# Patient Record
Sex: Male | Born: 1955 | Race: White | Hispanic: No | Marital: Married | Smoking: Never smoker
Health system: Southern US, Community
[De-identification: ages and names within clinical notes are randomized; demographics above are authoritative.]

## PROBLEM LIST (undated history)

## (undated) DIAGNOSIS — I10 Essential (primary) hypertension: Secondary | ICD-10-CM

## (undated) HISTORY — PX: RHINOPLASTY: SUR1284

## (undated) HISTORY — PX: HERNIA REPAIR: SHX51

---

## 2001-08-30 ENCOUNTER — Emergency Department (HOSPITAL_COMMUNITY): Admission: EM | Admit: 2001-08-30 | Discharge: 2001-08-30 | Payer: Self-pay | Admitting: Emergency Medicine

## 2001-09-07 ENCOUNTER — Emergency Department (HOSPITAL_COMMUNITY): Admission: EM | Admit: 2001-09-07 | Discharge: 2001-09-07 | Payer: Self-pay | Admitting: Emergency Medicine

## 2006-12-28 ENCOUNTER — Emergency Department (HOSPITAL_COMMUNITY): Admission: EM | Admit: 2006-12-28 | Discharge: 2006-12-28 | Payer: Self-pay | Admitting: Emergency Medicine

## 2010-06-21 ENCOUNTER — Other Ambulatory Visit: Payer: Self-pay | Admitting: Gastroenterology

## 2010-06-21 ENCOUNTER — Ambulatory Visit (HOSPITAL_COMMUNITY)
Admission: RE | Admit: 2010-06-21 | Discharge: 2010-06-21 | Disposition: A | Discharge status: Home / Self Care | Payer: 59 | Source: Ambulatory Visit | Attending: Gastroenterology | Admitting: Gastroenterology

## 2010-06-21 DIAGNOSIS — D126 Benign neoplasm of colon, unspecified: Secondary | ICD-10-CM | POA: Insufficient documentation

## 2010-06-21 DIAGNOSIS — Z1211 Encounter for screening for malignant neoplasm of colon: Secondary | ICD-10-CM | POA: Insufficient documentation

## 2010-06-22 NOTE — Op Note (Signed)
  NAME:  Timothy Hoover, VANGIESON NO.:  [id number]  MEDICAL RECORD NO.:  [id number]           PATIENT TYPE:  O  LOCATION:  [facility]                         FACILITY:  [facility]  PHYSICIAN:  Danise Edge, M.D.   DATE OF BIRTH:  April 26, 1955  DATE OF PROCEDURE:  06/21/2010 DATE OF DISCHARGE:                              OPERATIVE REPORT   REFERRING PHYSICIAN:  Georgann Housekeeper, MD.  HISTORY:  Mr. Kenyata Guess is a 55 year old male born November 22, 1955.  The patient is scheduled to undergo his first screening colonoscopy with polypectomy to prevent colon cancer.  ENDOSCOPIST:  Danise Edge, M.D.  PREMEDICATIONS: 1. Fentanyl 100 mcg. 2. Versed 10 mg.  PROCEDURE:  After obtaining informed consent, the patient was placed in the left lateral decubitus position.  Anal inspection and digital rectal exam were normal.  The Pentax pediatric colonoscope was introduced into the rectum and advanced to the cecum.  Colonic preparation for the exam today was good.  The procedure was somewhat technically difficult to perform due to sigmoid colon loop formation.  Rectum normal.  Retroflex view of the distal rectum normal.  Sigmoid colon and descending colon normal.  Splenic flexure normal.  Transverse colon normal.  Hepatic flexure normal.  Ascending colon normal.  Cecum and ileocecal valve: From the proximal cecum, a 5-mm sessile polyp was removed with the cold snare.  ASSESSMENT: 1. A 5-mm sessile polyp was removed from the cecum with cold snare. 2. Otherwise normal screening proctocolonoscopy to the cecum.  RECOMMENDATIONS:  If cecal polyp returns neoplastic pathologically, the patient should undergo a surveillance colonoscopy in 5 years.  If the cecal polyp is nonneoplastic pathologically, the patient should undergo a repeat screening colonoscopy in 10 years.          ______________________________ Danise Edge, M.D.     MJ/MEDQ  D:  06/21/2010  T:  06/21/2010   Job:  [id number]  cc:   Georgann Housekeeper, MD Fax: [phone]  Electronically Signed by Danise Edge M.D. on 06/22/2010 01:11:59 PM

## 2011-12-06 ENCOUNTER — Emergency Department (HOSPITAL_COMMUNITY)
Admission: EM | Admit: 2011-12-06 | Discharge: 2011-12-07 | Disposition: A | Discharge status: Home / Self Care | Payer: 59 | Attending: Emergency Medicine | Admitting: Emergency Medicine

## 2011-12-06 ENCOUNTER — Encounter (HOSPITAL_COMMUNITY): Payer: Self-pay | Admitting: Emergency Medicine

## 2011-12-06 DIAGNOSIS — Z79899 Other long term (current) drug therapy: Secondary | ICD-10-CM | POA: Insufficient documentation

## 2011-12-06 DIAGNOSIS — H539 Unspecified visual disturbance: Secondary | ICD-10-CM

## 2011-12-06 DIAGNOSIS — I10 Essential (primary) hypertension: Secondary | ICD-10-CM | POA: Insufficient documentation

## 2011-12-06 DIAGNOSIS — H538 Other visual disturbances: Secondary | ICD-10-CM | POA: Insufficient documentation

## 2011-12-06 HISTORY — DX: Essential (primary) hypertension: I10

## 2011-12-06 NOTE — ED Notes (Addendum)
Pt reports being compliant with medications. Pt was panicked over his right eye having blurry vision with flashes of light and blames his high blood pressure on that. Pt reports having flashing lights that started this morning when he was standing at the kitchen making breakfast.

## 2011-12-06 NOTE — ED Notes (Signed)
Patient states that he has flashes of light  Earlier this am to his right eye. The patient also reports that he has intermittent blurred vision to his right eye - also lighting flash to his eye

## 2011-12-07 NOTE — ED Notes (Signed)
MD at bedside. 

## 2011-12-07 NOTE — ED Notes (Signed)
Dr. Lorenso Courier informed. No orders given.

## 2011-12-07 NOTE — ED Provider Notes (Signed)
History     CSN: [id number]  Arrival date & time 12/06/11  2215   First MD Initiated Contact with Patient 12/07/11 0100      Chief Complaint  Patient presents with  . Blurred Vision  . Hypertension    (Consider location/radiation/quality/duration/timing/severity/associated sxs/prior treatment) HPI Comments: Timothy Hoover presents ambulatory for evaluation of a visual disturbance.  He states that he has seen painless light flashes like lightening out of the lateral corner of his right eye.  He reports that they are associated with a transient blurring of the vision in the right eye but states that he feels as if both his central and peripheral vision or at their baseline currently.  He denies and eye trauma, hx of retinal detachment, tearing, FB sensation, eye redness, or pain.  He denies any other complaints and states he was concerned after reading internet sites that he may have a retinal detachment.  He does wear eyeglasses, contact lenses, and has previously had laser eye surgery.  He also reports a hx of floaters.  Patient is a 56 y.o. male presenting with hypertension. The history is provided by the patient. No language interpreter was used.  Hypertension This is a new problem. The current episode started 3 to 5 hours ago. The problem occurs rarely. Progression since onset: occurring sporadically. Pertinent negatives include no chest pain, no abdominal pain, no headaches and no shortness of breath. Nothing aggravates the symptoms. Nothing relieves the symptoms. He has tried nothing for the symptoms.    Past Medical History  Diagnosis Date  . Hypertension     Past Surgical History  Procedure Date  . Hernia repair   . Rhinoplasty     History reviewed. No pertinent family history.  History  Substance Use Topics  . Smoking status: Never Smoker   . Smokeless tobacco: Not on file  . Alcohol Use: Yes     occasional      Review of Systems  Constitutional: Negative.   HENT:  Negative.   Eyes: Positive for visual disturbance. Negative for photophobia, pain, discharge, redness and itching.  Respiratory: Negative.  Negative for shortness of breath.   Cardiovascular: Negative for chest pain.  Gastrointestinal: Negative.  Negative for abdominal pain.  Genitourinary: Negative.   Musculoskeletal: Negative.   Neurological: Negative for headaches.  Psychiatric/Behavioral: Negative.     Allergies  Review of patient's allergies indicates no known allergies.  Home Medications   Current Outpatient Rx  Name Route Sig Dispense Refill  . OMEGA-3 FATTY ACIDS 1000 MG PO CAPS Oral Take 1 g by mouth daily.    Marland Kitchen RAMIPRIL 5 MG PO CAPS Oral Take 5 mg by mouth daily.      BP 152/93  Pulse 56  Temp 97.9 F (36.6 C) (Oral)  Resp 18  SpO2 99%  Physical Exam  Constitutional: He is oriented to person, place, and time. He appears well-developed and well-nourished. No distress.  HENT:  Head: Normocephalic and atraumatic. Not macrocephalic and not microcephalic. Head is without abrasion. Hair is normal. No trismus in the jaw.  Right Ear: Tympanic membrane, external ear and ear canal normal.  Left Ear: Hearing, tympanic membrane, external ear and ear canal normal.  Nose: Nose normal. No mucosal edema or rhinorrhea. No epistaxis.  No foreign bodies.  Mouth/Throat: Uvula is midline, oropharynx is clear and moist and mucous membranes are normal. Mucous membranes are not pale, not dry and not cyanotic. He does not have dentures. No oral lesions. Normal dentition. No  dental abscesses, uvula swelling, lacerations or dental caries. No oropharyngeal exudate.  Eyes: Conjunctivae, EOM and lids are normal. Pupils are equal, round, and reactive to light. Right eye exhibits no chemosis, no discharge, no exudate and no hordeolum. No foreign body present in the right eye. Left eye exhibits no chemosis, no discharge, no exudate and no hordeolum. No foreign body present in the left eye. Right  conjunctiva is not injected. Right conjunctiva has no hemorrhage. Left conjunctiva is not injected. Left conjunctiva has no hemorrhage. No scleral icterus. Right eye exhibits normal extraocular motion and no nystagmus. Left eye exhibits normal extraocular motion and no nystagmus. Right pupil is round and reactive. Left pupil is round and reactive. Pupils are equal.  Fundoscopic exam:      The right eye shows no arteriolar narrowing, no exudate, no hemorrhage and no papilledema. The right eye shows no red reflex.      The left eye shows no arteriolar narrowing, no exudate, no hemorrhage and no papilledema. The left eye shows no red reflex. Neck: Normal range of motion. Neck supple. No JVD present. No tracheal deviation present. No thyromegaly present.  Cardiovascular: Normal rate, regular rhythm, normal heart sounds and intact distal pulses.  Exam reveals no gallop and no friction rub.   No murmur heard. Pulmonary/Chest: Effort normal and breath sounds normal. No stridor. No respiratory distress. He has no wheezes. He has no rales. He exhibits no tenderness.  Abdominal: Soft. Bowel sounds are normal. There is no tenderness. There is no guarding.  Musculoskeletal: Normal range of motion. He exhibits no edema and no tenderness.  Lymphadenopathy:    He has no cervical adenopathy.  Neurological: He is alert and oriented to person, place, and time. No cranial nerve deficit. Coordination normal.  Skin: Skin is warm and dry. No rash noted. He is not diaphoretic. No erythema. No pallor.  Psychiatric: He has a normal mood and affect. His behavior is normal. Thought content normal.    ED Course  Procedures (including critical care time)  Labs Reviewed - No data to display No results found.   No diagnosis found.    MDM  Pt presents for evaluation of a unilateral visual disturbance.  There is no asymmetry on his fundoscopic exam and he has a normal visual acuity.  He is having no pain and describes  sporadic intermittent flashes of light only visible out of the corner of his eye.  On exam, no evidence of a retinal detachment are appreciated.  Plan at this time is to discharge home.  He has been instructed to follow-up closely with either the on-call eye specialist of his eye doctor within the next 72 hours.  He has also been instructed that if the symptoms are to worsen or if he appreciates any change in his visual acuity to return immediately to the emergency department if he cannot see his eye specialist immediately.        Tobin Chad, MD 12/07/11 [phone]

## 2011-12-07 NOTE — ED Notes (Signed)
MD left bedside

## 2011-12-07 NOTE — ED Notes (Signed)
Pt reports no flashing lights now, but it happens every once in awhile throughout the day since this morning. Pt states the blurry vision is "probably due to dry eye because when I blink it's gone."

## 2012-01-16 ENCOUNTER — Other Ambulatory Visit: Payer: Self-pay | Admitting: Dermatology

## 2015-05-17 DIAGNOSIS — M720 Palmar fascial fibromatosis [Dupuytren]: Secondary | ICD-10-CM | POA: Diagnosis not present

## 2015-05-17 DIAGNOSIS — B078 Other viral warts: Secondary | ICD-10-CM | POA: Diagnosis not present

## 2015-05-17 DIAGNOSIS — Z85828 Personal history of other malignant neoplasm of skin: Secondary | ICD-10-CM | POA: Diagnosis not present

## 2015-06-14 MED FILL — RAMIPRIL 5 MG CAPSULE: 5 | 90 days supply | Qty: 90 | Fill #3

## 2015-07-19 DIAGNOSIS — N403 Nodular prostate with lower urinary tract symptoms: Secondary | ICD-10-CM | POA: Diagnosis not present

## 2015-07-26 DIAGNOSIS — N5201 Erectile dysfunction due to arterial insufficiency: Secondary | ICD-10-CM | POA: Diagnosis not present

## 2015-07-26 DIAGNOSIS — R351 Nocturia: Secondary | ICD-10-CM | POA: Diagnosis not present

## 2015-07-26 DIAGNOSIS — Z0000 Encounter for general adult medical examination without abnormal findings: Secondary | ICD-10-CM | POA: Diagnosis not present

## 2015-07-26 DIAGNOSIS — N403 Nodular prostate with lower urinary tract symptoms: Secondary | ICD-10-CM | POA: Diagnosis not present

## 2015-08-08 ENCOUNTER — Other Ambulatory Visit: Payer: Self-pay | Admitting: Internal Medicine

## 2015-08-08 DIAGNOSIS — R7309 Other abnormal glucose: Secondary | ICD-10-CM | POA: Diagnosis not present

## 2015-08-08 DIAGNOSIS — Z1389 Encounter for screening for other disorder: Secondary | ICD-10-CM | POA: Diagnosis not present

## 2015-08-08 DIAGNOSIS — Z0000 Encounter for general adult medical examination without abnormal findings: Secondary | ICD-10-CM | POA: Diagnosis not present

## 2015-08-08 DIAGNOSIS — N182 Chronic kidney disease, stage 2 (mild): Secondary | ICD-10-CM | POA: Diagnosis not present

## 2015-08-08 DIAGNOSIS — K219 Gastro-esophageal reflux disease without esophagitis: Secondary | ICD-10-CM | POA: Diagnosis not present

## 2015-08-08 DIAGNOSIS — R1011 Right upper quadrant pain: Secondary | ICD-10-CM

## 2015-08-08 DIAGNOSIS — R109 Unspecified abdominal pain: Secondary | ICD-10-CM | POA: Diagnosis not present

## 2015-08-08 DIAGNOSIS — I10 Essential (primary) hypertension: Secondary | ICD-10-CM | POA: Diagnosis not present

## 2015-08-08 DIAGNOSIS — E782 Mixed hyperlipidemia: Secondary | ICD-10-CM | POA: Diagnosis not present

## 2015-08-08 DIAGNOSIS — N402 Nodular prostate without lower urinary tract symptoms: Secondary | ICD-10-CM | POA: Diagnosis not present

## 2015-08-09 DIAGNOSIS — H524 Presbyopia: Secondary | ICD-10-CM | POA: Diagnosis not present

## 2015-08-14 ENCOUNTER — Other Ambulatory Visit: Payer: Self-pay | Admitting: Internal Medicine

## 2015-08-14 ENCOUNTER — Ambulatory Visit
Admission: RE | Admit: 2015-08-14 | Discharge: 2015-08-14 | Disposition: A | Discharge status: Home / Self Care | Payer: 59 | Source: Ambulatory Visit | Attending: Internal Medicine | Admitting: Internal Medicine

## 2015-08-14 DIAGNOSIS — E278 Other specified disorders of adrenal gland: Secondary | ICD-10-CM

## 2015-08-14 DIAGNOSIS — R1011 Right upper quadrant pain: Secondary | ICD-10-CM | POA: Diagnosis not present

## 2015-08-16 MED FILL — diazePAM 5 MG TABS: 5 | 1 days supply | Qty: 2 | Fill #0

## 2015-08-19 ENCOUNTER — Ambulatory Visit
Admission: RE | Admit: 2015-08-19 | Discharge: 2015-08-19 | Disposition: A | Discharge status: Home / Self Care | Payer: 59 | Source: Ambulatory Visit | Attending: Internal Medicine | Admitting: Internal Medicine

## 2015-08-19 DIAGNOSIS — E278 Other specified disorders of adrenal gland: Secondary | ICD-10-CM

## 2015-08-19 DIAGNOSIS — K8689 Other specified diseases of pancreas: Secondary | ICD-10-CM | POA: Diagnosis not present

## 2015-08-19 MED ORDER — GADOBENATE DIMEGLUMINE 529 MG/ML IV SOLN
20.0000 mL | Freq: Once | INTRAVENOUS | Status: AC | PRN
  Administered 2015-08-19: 18 mL via INTRAVENOUS

## 2015-09-12 MED FILL — RAMIPRIL 5 MG CAPSULE: 5 | 90 days supply | Qty: 90 | Fill #0

## 2015-09-13 ENCOUNTER — Other Ambulatory Visit: Payer: Self-pay | Admitting: Gastroenterology

## 2015-09-13 MED FILL — GAVILYTE-N SOLUTION: 420 | 1 days supply | Qty: 4000 | Fill #0

## 2015-10-23 ENCOUNTER — Encounter (HOSPITAL_COMMUNITY): Payer: Self-pay | Admitting: *Deleted

## 2015-10-31 ENCOUNTER — Ambulatory Visit (HOSPITAL_COMMUNITY): Payer: 59 | Admitting: Anesthesiology

## 2015-10-31 ENCOUNTER — Ambulatory Visit (HOSPITAL_COMMUNITY)
Admission: RE | Admit: 2015-10-31 | Discharge: 2015-10-31 | Disposition: A | Discharge status: Home / Self Care | Payer: 59 | Source: Ambulatory Visit | Attending: Gastroenterology | Admitting: Gastroenterology

## 2015-10-31 ENCOUNTER — Encounter (HOSPITAL_COMMUNITY)
Admission: RE | Disposition: A | Discharge status: Home / Self Care | Payer: Self-pay | Source: Ambulatory Visit | Attending: Gastroenterology

## 2015-10-31 ENCOUNTER — Encounter (HOSPITAL_COMMUNITY): Payer: Self-pay

## 2015-10-31 DIAGNOSIS — I10 Essential (primary) hypertension: Secondary | ICD-10-CM | POA: Diagnosis not present

## 2015-10-31 DIAGNOSIS — Z86010 Personal history of colonic polyps: Secondary | ICD-10-CM | POA: Diagnosis not present

## 2015-10-31 DIAGNOSIS — Z1211 Encounter for screening for malignant neoplasm of colon: Secondary | ICD-10-CM | POA: Insufficient documentation

## 2015-10-31 DIAGNOSIS — K5790 Diverticulosis of intestine, part unspecified, without perforation or abscess without bleeding: Secondary | ICD-10-CM | POA: Diagnosis not present

## 2015-10-31 HISTORY — PX: COLONOSCOPY WITH PROPOFOL: SHX5780

## 2015-10-31 SURGERY — COLONOSCOPY WITH PROPOFOL
Anesthesia: Monitor Anesthesia Care

## 2015-10-31 MED ORDER — LACTATED RINGERS IV SOLN
INTRAVENOUS | Status: DC
  Administered 2015-10-31: 09:00:00 via INTRAVENOUS

## 2015-10-31 MED ORDER — PROPOFOL 500 MG/50ML IV EMUL
INTRAVENOUS | Status: DC | PRN
  Administered 2015-10-31: 100 ug/kg/min via INTRAVENOUS

## 2015-10-31 MED ORDER — GLYCOPYRROLATE 0.2 MG/ML IJ SOLN
INTRAMUSCULAR | Status: AC
  Filled 2015-10-31: qty 1

## 2015-10-31 MED ORDER — PROPOFOL 10 MG/ML IV BOLUS
INTRAVENOUS | Status: AC
  Filled 2015-10-31: qty 60

## 2015-10-31 MED ORDER — LIDOCAINE HCL (CARDIAC) 20 MG/ML IV SOLN
INTRAVENOUS | Status: AC
  Filled 2015-10-31: qty 5

## 2015-10-31 MED ORDER — PROPOFOL 10 MG/ML IV BOLUS
INTRAVENOUS | Status: DC | PRN
  Administered 2015-10-31: 20 mg via INTRAVENOUS
  Administered 2015-10-31: 40 mg via INTRAVENOUS
  Administered 2015-10-31: 20 mg via INTRAVENOUS
  Administered 2015-10-31: 80 mg via INTRAVENOUS

## 2015-10-31 MED ORDER — LIDOCAINE HCL (CARDIAC) 20 MG/ML IV SOLN
INTRAVENOUS | Status: DC | PRN
  Administered 2015-10-31: 50 mg via INTRAVENOUS

## 2015-10-31 MED ORDER — SODIUM CHLORIDE 0.9 % IV SOLN: INTRAVENOUS | Status: DC

## 2015-10-31 MED ORDER — GLYCOPYRROLATE 0.2 MG/ML IJ SOLN
INTRAMUSCULAR | Status: DC | PRN
  Administered 2015-10-31: 0.2 mg via INTRAVENOUS

## 2015-10-31 SURGICAL SUPPLY — 21 items

## 2015-10-31 NOTE — Op Note (Signed)
[facility] Patient Name: Timothy Hoover Procedure Date: 10/31/2015 MRN: [id number] Attending MD: Garlan Fair , MD Date of Birth: [birth year] CSN: [id number] Age: 60 Admit Type: Outpatient Procedure:                Colonoscopy Indications:              High risk colon cancer surveillance: 06/21/2010                            colonoscopy was performed with removal of a 5 mm                            cecal tubular adenomatous polyp. Providers:                Garlan Fair, MD, Carmie End, RN, [facility] Tech, Technician Referring MD:              Medicines:                Propofol per Anesthesia Complications:            No immediate complications. Estimated Blood Loss:     Estimated blood loss: none. Procedure:                Pre-Anesthesia Assessment:                           - Prior to the procedure, a History and Physical                            was performed, and patient medications and                            allergies were reviewed. The patient's tolerance of                            previous anesthesia was also reviewed. The risks                            and benefits of the procedure and the sedation                            options and risks were discussed with the patient.                            All questions were answered, and informed consent                            was obtained. Prior Anticoagulants: The patient has                            taken no previous anticoagulant or antiplatelet  agents. ASA Grade Assessment: II - A patient with                            mild systemic disease. After reviewing the risks                            and benefits, the patient was deemed in                            satisfactory condition to undergo the procedure.                           After obtaining informed consent, the colonoscope                            was passed  under direct vision. Throughout the                            procedure, the patient's blood pressure, pulse, and                            oxygen saturations were monitored continuously. The                            Colonoscope was introduced through the anus and                            advanced to the the cecum, identified by                            appendiceal orifice and ileocecal valve. The                            colonoscopy was somewhat difficult due to                            significant looping. The patient tolerated the                            procedure well. The quality of the bowel                            preparation was good. The terminal ileum, the                            ileocecal valve, the appendiceal orifice and the                            rectum were photographed. Scope In: 10:09:48 AM Scope Out: 10:30:25 AM Scope Withdrawal Time: 0 hours 10 minutes 52 seconds  Total Procedure Duration: 0 hours 20 minutes 37 seconds  Findings:      The perianal and digital rectal examinations were normal.      The entire examined colon appeared normal. Impression:               -  The entire examined colon is normal.                           - No specimens collected. Moderate Sedation:      N/A- Per Anesthesia Care Recommendation:           - Repeat colonoscopy in 10 years for surveillance.                           - Patient has a contact number available for                            emergencies. The signs and symptoms of potential                            delayed complications were discussed with the                            patient. Return to normal activities tomorrow.                            Written discharge instructions were provided to the                            patient.                           - Resume previous diet.                           - Continue present medications. Procedure Code(s):        --- Professional ---                            [phone], Colonoscopy, flexible; diagnostic, including                            collection of specimen(s) by brushing or washing,                            when performed (separate procedure) Diagnosis Code(s):        --- Professional ---                           Z86.010, Personal history of colonic polyps CPT copyright 2016 [employer]. All rights reserved. The codes documented in this report are preliminary and upon coder review may  be revised to meet current compliance requirements. Earle Gell, MD Garlan Fair, MD 10/31/2015 10:36:48 AM This report has been signed electronically. Number of Addenda: 0

## 2015-10-31 NOTE — Transfer of Care (Signed)
Immediate Anesthesia Transfer of Care Note  Patient: Timothy Hoover  Procedure(s) Performed: Procedure(s): COLONOSCOPY WITH PROPOFOL (N/A)  Patient Location: PACU  Anesthesia Type:MAC  Level of Consciousness: Patient easily awoken, sedated, comfortable, cooperative, following commands, responds to stimulation.   Airway & Oxygen Therapy: Patient spontaneously breathing, ventilating well, oxygen via simple oxygen mask.  Post-op Assessment: Report given to PACU RN, vital signs reviewed and stable, moving all extremities.   Post vital signs: Reviewed and stable.  Complications: No apparent anesthesia complications

## 2015-10-31 NOTE — H&P (Signed)
  Procedure: Surveillance colonoscopy. 06/21/2010 colonoscopy was performed with removal of a 5 mm tubular adenomatous cecal polyp. 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 He is scheduled to undergo a surveillance colonoscopy today.  Past medical history: Right inguinal hernia surgery. Deviated septum surgery. Left on detached retina repair. Gastroesophageal reflux. Hypertension.  Exam: The patient is alert and lying comfortably on the endoscopy stretcher. Abdomen is soft and nontender to palpation. Clear to auscultation. Cardiac exam reveals a regular rhythm.  Plan: Proceed with surveillance colonoscopy

## 2015-10-31 NOTE — Anesthesia Postprocedure Evaluation (Signed)
Anesthesia Post Note  Patient: Timothy Hoover  Procedure(s) Performed: Procedure(s) (LRB): COLONOSCOPY WITH PROPOFOL (N/A)  Patient location during evaluation: PACU Anesthesia Type: MAC Level of consciousness: awake and alert and oriented Pain management: pain level controlled Vital Signs Assessment: post-procedure vital signs reviewed and stable Respiratory status: spontaneous breathing, nonlabored ventilation and respiratory function stable Cardiovascular status: blood pressure returned to baseline and stable Postop Assessment: no signs of nausea or vomiting Anesthetic complications: no    Last Vitals:  Filed Vitals:   10/31/15 1036 10/31/15 1040  BP: 118/75 105/71  Pulse: 56 56  Temp:    Resp: 16 14    Last Pain: There were no vitals filed for this visit.               Lash Matulich A.

## 2015-10-31 NOTE — Discharge Instructions (Signed)

## 2015-10-31 NOTE — Anesthesia Postprocedure Evaluation (Signed)
Anesthesia Post Note  Patient: Timothy Hoover  Procedure(s) Performed: Procedure(s) (LRB): COLONOSCOPY WITH PROPOFOL (N/A)  Patient location during evaluation: PACU Anesthesia Type: MAC Level of consciousness: awake and alert and oriented Pain management: pain level controlled Vital Signs Assessment: post-procedure vital signs reviewed and stable Respiratory status: spontaneous breathing, nonlabored ventilation and respiratory function stable Cardiovascular status: blood pressure returned to baseline and stable Postop Assessment: no signs of nausea or vomiting Anesthetic complications: no    Last Vitals:  Filed Vitals:   10/31/15 1036 10/31/15 1040  BP: 118/75 105/71  Pulse: 56 56  Temp:    Resp: 16 14    Last Pain: There were no vitals filed for this visit.               [name]

## 2015-10-31 NOTE — Anesthesia Preprocedure Evaluation (Signed)
Anesthesia Evaluation  Patient identified by MRN, date of birth, ID band Patient awake    Reviewed: Allergy & Precautions, NPO status , Patient's Chart, lab work & pertinent test results  Airway Mallampati: II  TM Distance: >3 FB Neck ROM: Full    Dental no notable dental hx. (+) Teeth Intact   Pulmonary neg pulmonary ROS,    Pulmonary exam normal breath sounds clear to auscultation       Cardiovascular hypertension, Pt. on medications Normal cardiovascular exam Rhythm:Regular Rate:Normal     Neuro/Psych negative neurological ROS  negative psych ROS   GI/Hepatic negative GI ROS, Neg liver ROS,   Endo/Other  negative endocrine ROS  Renal/GU negative Renal ROS  negative genitourinary   Musculoskeletal negative musculoskeletal ROS (+)   Abdominal   Peds  Hematology negative hematology ROS (+)   Anesthesia Other Findings   Reproductive/Obstetrics negative OB ROS                             Anesthesia Physical Anesthesia Plan  ASA: II  Anesthesia Plan: MAC   Post-op Pain Management:    Induction: Intravenous  Airway Management Planned: Natural Airway and Simple Face Mask  Additional Equipment:   Intra-op Plan:   Post-operative Plan:   Informed Consent: I have reviewed the patients History and Physical, chart, labs and discussed the procedure including the risks, benefits and alternatives for the proposed anesthesia with the patient or authorized representative who has indicated his/her understanding and acceptance.   Dental advisory given  Plan Discussed with: Anesthesiologist, CRNA and Surgeon  Anesthesia Plan Comments:         Anesthesia Quick Evaluation

## 2015-11-01 ENCOUNTER — Encounter (HOSPITAL_COMMUNITY): Payer: Self-pay | Admitting: Gastroenterology

## 2015-12-13 MED FILL — RAMIPRIL 5 MG CAPSULE: 5 | 90 days supply | Qty: 90 | Fill #1

## 2015-12-21 DIAGNOSIS — H35372 Puckering of macula, left eye: Secondary | ICD-10-CM | POA: Diagnosis not present

## 2015-12-21 DIAGNOSIS — H43392 Other vitreous opacities, left eye: Secondary | ICD-10-CM | POA: Diagnosis not present

## 2015-12-21 DIAGNOSIS — H43812 Vitreous degeneration, left eye: Secondary | ICD-10-CM | POA: Diagnosis not present

## 2016-02-14 DIAGNOSIS — L570 Actinic keratosis: Secondary | ICD-10-CM | POA: Diagnosis not present

## 2016-02-14 DIAGNOSIS — Z85828 Personal history of other malignant neoplasm of skin: Secondary | ICD-10-CM | POA: Diagnosis not present

## 2016-02-14 DIAGNOSIS — L821 Other seborrheic keratosis: Secondary | ICD-10-CM | POA: Diagnosis not present

## 2016-02-14 MED FILL — FLUOROURACIL 5% CREAM: 5 | 10 days supply | Qty: 40 | Fill #0

## 2016-03-13 MED FILL — RAMIPRIL 5 MG CAPSULE: 5 | 90 days supply | Qty: 90 | Fill #2

## 2016-04-30 MED FILL — valACYclovir HCL 1 GM TABS: 1 | 1 days supply | Qty: 4 | Fill #0

## 2016-05-13 DIAGNOSIS — I10 Essential (primary) hypertension: Secondary | ICD-10-CM | POA: Diagnosis not present

## 2016-05-13 DIAGNOSIS — Z8249 Family history of ischemic heart disease and other diseases of the circulatory system: Secondary | ICD-10-CM | POA: Diagnosis not present

## 2016-05-13 DIAGNOSIS — Z23 Encounter for immunization: Secondary | ICD-10-CM | POA: Diagnosis not present

## 2016-05-13 DIAGNOSIS — D3501 Benign neoplasm of right adrenal gland: Secondary | ICD-10-CM | POA: Diagnosis not present

## 2016-05-15 DIAGNOSIS — I10 Essential (primary) hypertension: Secondary | ICD-10-CM | POA: Diagnosis not present

## 2016-05-15 DIAGNOSIS — D3501 Benign neoplasm of right adrenal gland: Secondary | ICD-10-CM | POA: Diagnosis not present

## 2016-05-21 MED FILL — DEXAMETHASONE 1 MG TABLET: 1 | 1 days supply | Qty: 1 | Fill #0

## 2016-05-24 DIAGNOSIS — D3501 Benign neoplasm of right adrenal gland: Secondary | ICD-10-CM | POA: Diagnosis not present

## 2016-05-24 DIAGNOSIS — Z8249 Family history of ischemic heart disease and other diseases of the circulatory system: Secondary | ICD-10-CM | POA: Diagnosis not present

## 2016-05-24 DIAGNOSIS — I10 Essential (primary) hypertension: Secondary | ICD-10-CM | POA: Diagnosis not present

## 2016-05-24 DIAGNOSIS — R7301 Impaired fasting glucose: Secondary | ICD-10-CM | POA: Diagnosis not present

## 2016-07-04 MED FILL — RAMIPRIL 5 MG CAPSULE: 5 | 90 days supply | Qty: 90 | Fill #3

## 2016-07-05 DIAGNOSIS — H5213 Myopia, bilateral: Secondary | ICD-10-CM | POA: Diagnosis not present

## 2016-07-05 DIAGNOSIS — H524 Presbyopia: Secondary | ICD-10-CM | POA: Diagnosis not present

## 2016-07-23 DIAGNOSIS — M238X1 Other internal derangements of right knee: Secondary | ICD-10-CM | POA: Diagnosis not present

## 2016-08-08 ENCOUNTER — Other Ambulatory Visit: Payer: Self-pay | Admitting: Internal Medicine

## 2016-08-08 DIAGNOSIS — D3501 Benign neoplasm of right adrenal gland: Secondary | ICD-10-CM | POA: Diagnosis not present

## 2016-08-08 DIAGNOSIS — N402 Nodular prostate without lower urinary tract symptoms: Secondary | ICD-10-CM | POA: Diagnosis not present

## 2016-08-08 DIAGNOSIS — Z0000 Encounter for general adult medical examination without abnormal findings: Secondary | ICD-10-CM | POA: Diagnosis not present

## 2016-08-08 DIAGNOSIS — I10 Essential (primary) hypertension: Secondary | ICD-10-CM | POA: Diagnosis not present

## 2016-08-08 DIAGNOSIS — D136 Benign neoplasm of pancreas: Secondary | ICD-10-CM | POA: Diagnosis not present

## 2016-08-08 DIAGNOSIS — R7309 Other abnormal glucose: Secondary | ICD-10-CM | POA: Diagnosis not present

## 2016-08-08 DIAGNOSIS — Z23 Encounter for immunization: Secondary | ICD-10-CM | POA: Diagnosis not present

## 2016-08-08 DIAGNOSIS — Z1389 Encounter for screening for other disorder: Secondary | ICD-10-CM | POA: Diagnosis not present

## 2016-08-08 MED FILL — diazePAM 5 MG TABS: 5 | 1 days supply | Qty: 1 | Fill #0

## 2016-08-14 DIAGNOSIS — N402 Nodular prostate without lower urinary tract symptoms: Secondary | ICD-10-CM | POA: Diagnosis not present

## 2016-08-14 DIAGNOSIS — N5201 Erectile dysfunction due to arterial insufficiency: Secondary | ICD-10-CM | POA: Diagnosis not present

## 2016-08-24 ENCOUNTER — Ambulatory Visit
Admission: RE | Admit: 2016-08-24 | Discharge: 2016-08-24 | Disposition: A | Discharge status: Home / Self Care | Payer: 59 | Source: Ambulatory Visit | Attending: Internal Medicine | Admitting: Internal Medicine

## 2016-08-24 DIAGNOSIS — D136 Benign neoplasm of pancreas: Secondary | ICD-10-CM

## 2016-08-24 MED ORDER — GADOBENATE DIMEGLUMINE 529 MG/ML IV SOLN
20.0000 mL | Freq: Once | INTRAVENOUS | Status: AC | PRN
  Administered 2016-08-24: 19 mL via INTRAVENOUS

## 2016-09-04 MED FILL — valACYclovir HCL 1 GM TABS: 1 | 1 days supply | Qty: 4 | Fill #1

## 2016-09-09 DIAGNOSIS — R748 Abnormal levels of other serum enzymes: Secondary | ICD-10-CM | POA: Diagnosis not present

## 2016-09-09 DIAGNOSIS — N402 Nodular prostate without lower urinary tract symptoms: Secondary | ICD-10-CM | POA: Diagnosis not present

## 2016-10-02 MED FILL — RAMIPRIL 5 MG CAPSULE: 5 | 90 days supply | Qty: 90 | Fill #0

## 2016-10-22 MED FILL — valACYclovir HCL 1 GM TABS: 1 | 1 days supply | Qty: 4 | Fill #2

## 2016-12-04 DIAGNOSIS — Z23 Encounter for immunization: Secondary | ICD-10-CM | POA: Diagnosis not present

## 2016-12-11 MED FILL — valACYclovir HCL 1 GM TABS: 1 | 2 days supply | Qty: 4 | Fill #0

## 2016-12-18 MED FILL — SILDENAFIL 20 MG TABLET: 20 | 25 days supply | Qty: 50 | Fill #0

## 2017-01-06 MED FILL — RAMIPRIL 5 MG CAPSULE: 5 | 90 days supply | Qty: 90 | Fill #1

## 2017-02-05 DIAGNOSIS — Z23 Encounter for immunization: Secondary | ICD-10-CM | POA: Diagnosis not present

## 2017-03-04 MED FILL — SILDENAFIL 20 MG TABLET: 20 | 25 days supply | Qty: 50 | Fill #1

## 2017-04-09 DIAGNOSIS — D225 Melanocytic nevi of trunk: Secondary | ICD-10-CM | POA: Diagnosis not present

## 2017-04-09 DIAGNOSIS — D1801 Hemangioma of skin and subcutaneous tissue: Secondary | ICD-10-CM | POA: Diagnosis not present

## 2017-04-09 DIAGNOSIS — L570 Actinic keratosis: Secondary | ICD-10-CM | POA: Diagnosis not present

## 2017-04-09 DIAGNOSIS — L820 Inflamed seborrheic keratosis: Secondary | ICD-10-CM | POA: Diagnosis not present

## 2017-04-09 DIAGNOSIS — Z85828 Personal history of other malignant neoplasm of skin: Secondary | ICD-10-CM | POA: Diagnosis not present

## 2017-04-09 DIAGNOSIS — D485 Neoplasm of uncertain behavior of skin: Secondary | ICD-10-CM | POA: Diagnosis not present

## 2017-04-09 DIAGNOSIS — D2262 Melanocytic nevi of left upper limb, including shoulder: Secondary | ICD-10-CM | POA: Diagnosis not present

## 2017-04-09 DIAGNOSIS — D2261 Melanocytic nevi of right upper limb, including shoulder: Secondary | ICD-10-CM | POA: Diagnosis not present

## 2017-04-09 DIAGNOSIS — L821 Other seborrheic keratosis: Secondary | ICD-10-CM | POA: Diagnosis not present

## 2017-04-09 DIAGNOSIS — L819 Disorder of pigmentation, unspecified: Secondary | ICD-10-CM | POA: Diagnosis not present

## 2017-04-11 MED FILL — RAMIPRIL 5 MG CAPS: 5 | 90 days supply | Qty: 90 | Fill #2

## 2017-05-16 MED FILL — SILDENAFIL 20 MG TABLET: 20 | 25 days supply | Qty: 50 | Fill #2

## 2017-05-26 DIAGNOSIS — I10 Essential (primary) hypertension: Secondary | ICD-10-CM | POA: Diagnosis not present

## 2017-05-26 DIAGNOSIS — Z8249 Family history of ischemic heart disease and other diseases of the circulatory system: Secondary | ICD-10-CM | POA: Diagnosis not present

## 2017-05-26 DIAGNOSIS — D3501 Benign neoplasm of right adrenal gland: Secondary | ICD-10-CM | POA: Diagnosis not present

## 2017-05-26 DIAGNOSIS — R7303 Prediabetes: Secondary | ICD-10-CM | POA: Diagnosis not present

## 2017-07-03 DIAGNOSIS — H1789 Other corneal scars and opacities: Secondary | ICD-10-CM | POA: Diagnosis not present

## 2017-07-03 DIAGNOSIS — H59812 Chorioretinal scars after surgery for detachment, left eye: Secondary | ICD-10-CM | POA: Diagnosis not present

## 2017-07-03 DIAGNOSIS — H1045 Other chronic allergic conjunctivitis: Secondary | ICD-10-CM | POA: Diagnosis not present

## 2017-07-03 DIAGNOSIS — H5213 Myopia, bilateral: Secondary | ICD-10-CM | POA: Diagnosis not present

## 2017-07-03 DIAGNOSIS — H524 Presbyopia: Secondary | ICD-10-CM | POA: Diagnosis not present

## 2017-07-03 DIAGNOSIS — H52221 Regular astigmatism, right eye: Secondary | ICD-10-CM | POA: Diagnosis not present

## 2017-07-03 DIAGNOSIS — H43393 Other vitreous opacities, bilateral: Secondary | ICD-10-CM | POA: Diagnosis not present

## 2017-07-21 MED FILL — SILDENAFIL 20 MG TABLET: 20 | 25 days supply | Qty: 50 | Fill #3

## 2017-07-21 MED FILL — RAMIPRIL 5 MG CAPS: 5 | 90 days supply | Qty: 90 | Fill #3

## 2017-08-20 ENCOUNTER — Other Ambulatory Visit: Payer: Self-pay | Admitting: Internal Medicine

## 2017-08-20 DIAGNOSIS — N402 Nodular prostate without lower urinary tract symptoms: Secondary | ICD-10-CM | POA: Diagnosis not present

## 2017-08-20 DIAGNOSIS — D136 Benign neoplasm of pancreas: Secondary | ICD-10-CM

## 2017-08-20 DIAGNOSIS — Z1159 Encounter for screening for other viral diseases: Secondary | ICD-10-CM | POA: Diagnosis not present

## 2017-08-20 MED FILL — diazePAM 5 MG TABS: 5 | 1 days supply | Qty: 1 | Fill #0

## 2017-08-21 MED FILL — valACYclovir HCL 1 GM TABS: 1 | 2 days supply | Qty: 4 | Fill #1

## 2017-08-24 ENCOUNTER — Ambulatory Visit
Admission: RE | Admit: 2017-08-24 | Discharge: 2017-08-24 | Disposition: A | Discharge status: Home / Self Care | Payer: 59 | Source: Ambulatory Visit | Attending: Internal Medicine | Admitting: Internal Medicine

## 2017-08-24 DIAGNOSIS — D136 Benign neoplasm of pancreas: Secondary | ICD-10-CM | POA: Diagnosis not present

## 2017-08-24 MED ORDER — GADOBENATE DIMEGLUMINE 529 MG/ML IV SOLN
17.0000 mL | Freq: Once | INTRAVENOUS | Status: AC | PRN
  Administered 2017-08-24: 17 mL via INTRAVENOUS

## 2017-08-25 MED FILL — SIMVASTATIN 20 MG TABS: 20 | 30 days supply | Qty: 30 | Fill #0

## 2017-09-01 ENCOUNTER — Other Ambulatory Visit: Payer: Self-pay | Admitting: Urology

## 2017-09-01 DIAGNOSIS — R972 Elevated prostate specific antigen [PSA]: Secondary | ICD-10-CM

## 2017-09-01 DIAGNOSIS — N402 Nodular prostate without lower urinary tract symptoms: Secondary | ICD-10-CM

## 2017-09-01 MED FILL — diazePAM 10 MG TABS: 10 | 1 days supply | Qty: 2 | Fill #0

## 2017-09-10 ENCOUNTER — Ambulatory Visit
Admission: RE | Admit: 2017-09-10 | Discharge: 2017-09-10 | Disposition: A | Discharge status: Home / Self Care | Payer: 59 | Source: Ambulatory Visit | Attending: Urology | Admitting: Urology

## 2017-09-10 DIAGNOSIS — R972 Elevated prostate specific antigen [PSA]: Secondary | ICD-10-CM | POA: Diagnosis not present

## 2017-09-10 DIAGNOSIS — N402 Nodular prostate without lower urinary tract symptoms: Secondary | ICD-10-CM

## 2017-09-10 MED ORDER — GADOBENATE DIMEGLUMINE 529 MG/ML IV SOLN
18.0000 mL | Freq: Once | INTRAVENOUS | Status: AC | PRN
  Administered 2017-09-10: 18 mL via INTRAVENOUS

## 2017-09-26 MED FILL — SIMVASTATIN 20 MG TABS: 20 | 90 days supply | Qty: 90 | Fill #0

## 2017-09-26 MED FILL — SILDENAFIL 20 MG TABLET: 20 | 25 days supply | Qty: 50 | Fill #4

## 2017-11-06 MED FILL — RAMIPRIL 5 MG CAPS: 5 | 90 days supply | Qty: 90 | Fill #0

## 2017-11-14 MED FILL — diazePAM 10 MG TABS: 10 | 1 days supply | Qty: 1 | Fill #0

## 2017-11-14 MED FILL — levoFLOXacin 750 MG TABS: 750 | 1 days supply | Qty: 1 | Fill #0

## 2017-11-17 MED FILL — valACYclovir HCL 1 GM TABS: 1 | 2 days supply | Qty: 4 | Fill #0

## 2017-11-19 DIAGNOSIS — N402 Nodular prostate without lower urinary tract symptoms: Secondary | ICD-10-CM | POA: Diagnosis not present

## 2017-12-01 MED FILL — SILDENAFIL CITRATE 20 MG TA: 20 | 25 days supply | Qty: 50 | Fill #5

## 2017-12-26 DIAGNOSIS — E782 Mixed hyperlipidemia: Secondary | ICD-10-CM | POA: Diagnosis not present

## 2017-12-26 DIAGNOSIS — R221 Localized swelling, mass and lump, neck: Secondary | ICD-10-CM | POA: Diagnosis not present

## 2017-12-31 MED FILL — SIMVASTATIN 20 MG TABLET: 20 | 90 days supply | Qty: 90 | Fill #1

## 2018-01-07 DIAGNOSIS — D485 Neoplasm of uncertain behavior of skin: Secondary | ICD-10-CM | POA: Diagnosis not present

## 2018-01-07 DIAGNOSIS — C44311 Basal cell carcinoma of skin of nose: Secondary | ICD-10-CM | POA: Diagnosis not present

## 2018-01-07 DIAGNOSIS — Z85828 Personal history of other malignant neoplasm of skin: Secondary | ICD-10-CM | POA: Diagnosis not present

## 2018-02-09 MED FILL — RAMIPRIL 5 MG CAPS: 5 | 90 days supply | Qty: 90 | Fill #1

## 2018-02-09 MED FILL — SILDENAFIL CITRATE 20 MG TA: 20 | 25 days supply | Qty: 50 | Fill #0

## 2018-02-11 DIAGNOSIS — Z23 Encounter for immunization: Secondary | ICD-10-CM | POA: Diagnosis not present

## 2018-02-23 DIAGNOSIS — C44311 Basal cell carcinoma of skin of nose: Secondary | ICD-10-CM | POA: Diagnosis not present

## 2018-02-23 DIAGNOSIS — Z85828 Personal history of other malignant neoplasm of skin: Secondary | ICD-10-CM | POA: Diagnosis not present

## 2018-03-29 ENCOUNTER — Emergency Department (HOSPITAL_COMMUNITY): Payer: 59 | Admitting: Anesthesiology

## 2018-03-29 ENCOUNTER — Encounter (HOSPITAL_COMMUNITY): Payer: Self-pay | Admitting: Emergency Medicine

## 2018-03-29 ENCOUNTER — Ambulatory Visit (HOSPITAL_COMMUNITY)
Admission: EM | Admit: 2018-03-29 | Discharge: 2018-03-29 | Disposition: A | Discharge status: Home / Self Care | Payer: 59 | Attending: Emergency Medicine | Admitting: Emergency Medicine

## 2018-03-29 ENCOUNTER — Encounter (HOSPITAL_COMMUNITY)
Admission: EM | Disposition: A | Discharge status: Home / Self Care | Payer: Self-pay | Source: Home / Self Care | Attending: Emergency Medicine

## 2018-03-29 DIAGNOSIS — K209 Esophagitis, unspecified: Secondary | ICD-10-CM | POA: Insufficient documentation

## 2018-03-29 DIAGNOSIS — R632 Polyphagia: Secondary | ICD-10-CM | POA: Diagnosis not present

## 2018-03-29 DIAGNOSIS — T18108A Unspecified foreign body in esophagus causing other injury, initial encounter: Secondary | ICD-10-CM | POA: Diagnosis not present

## 2018-03-29 DIAGNOSIS — Z79899 Other long term (current) drug therapy: Secondary | ICD-10-CM | POA: Insufficient documentation

## 2018-03-29 DIAGNOSIS — X58XXXA Exposure to other specified factors, initial encounter: Secondary | ICD-10-CM | POA: Diagnosis not present

## 2018-03-29 DIAGNOSIS — R079 Chest pain, unspecified: Secondary | ICD-10-CM | POA: Diagnosis not present

## 2018-03-29 DIAGNOSIS — I10 Essential (primary) hypertension: Secondary | ICD-10-CM | POA: Insufficient documentation

## 2018-03-29 DIAGNOSIS — T18128A Food in esophagus causing other injury, initial encounter: Secondary | ICD-10-CM | POA: Diagnosis not present

## 2018-03-29 DIAGNOSIS — K222 Esophageal obstruction: Secondary | ICD-10-CM | POA: Diagnosis not present

## 2018-03-29 HISTORY — PX: ESOPHAGOGASTRODUODENOSCOPY (EGD) WITH PROPOFOL: SHX5813

## 2018-03-29 HISTORY — PX: FOREIGN BODY REMOVAL: SHX962

## 2018-03-29 SURGERY — ESOPHAGOGASTRODUODENOSCOPY (EGD) WITH PROPOFOL
Anesthesia: General

## 2018-03-29 MED ORDER — SUCCINYLCHOLINE CHLORIDE 200 MG/10ML IV SOSY
PREFILLED_SYRINGE | INTRAVENOUS | Status: DC | PRN
  Administered 2018-03-29: 180 mg via INTRAVENOUS

## 2018-03-29 MED ORDER — GLUCAGON HCL RDNA (DIAGNOSTIC) 1 MG IJ SOLR
1.0000 mg | Freq: Once | INTRAMUSCULAR | Status: AC
  Administered 2018-03-29: 1 mg via INTRAVENOUS
  Filled 2018-03-29: qty 1

## 2018-03-29 MED ORDER — EPHEDRINE SULFATE-NACL 50-0.9 MG/10ML-% IV SOSY
PREFILLED_SYRINGE | INTRAVENOUS | Status: DC | PRN
  Administered 2018-03-29: 5 mg via INTRAVENOUS

## 2018-03-29 MED ORDER — LIDOCAINE 2% (20 MG/ML) 5 ML SYRINGE
INTRAMUSCULAR | Status: DC | PRN
  Administered 2018-03-29: 80 mg via INTRAVENOUS

## 2018-03-29 MED ORDER — ONDANSETRON HCL 4 MG/2ML IJ SOLN
INTRAMUSCULAR | Status: DC | PRN
  Administered 2018-03-29: 4 mg via INTRAVENOUS

## 2018-03-29 MED ORDER — PROPOFOL 10 MG/ML IV BOLUS
INTRAVENOUS | Status: DC | PRN
  Administered 2018-03-29: 170 mg via INTRAVENOUS

## 2018-03-29 MED ORDER — FENTANYL CITRATE (PF) 250 MCG/5ML IJ SOLN
INTRAMUSCULAR | Status: DC | PRN
  Administered 2018-03-29 (×2): 50 ug via INTRAVENOUS

## 2018-03-29 MED ORDER — MIDAZOLAM HCL 2 MG/2ML IJ SOLN
INTRAMUSCULAR | Status: AC
  Filled 2018-03-29: qty 2

## 2018-03-29 MED ORDER — LACTATED RINGERS IV SOLN
INTRAVENOUS | Status: DC
  Administered 2018-03-29: 1000 mL via INTRAVENOUS

## 2018-03-29 MED ORDER — PROPOFOL 10 MG/ML IV BOLUS
INTRAVENOUS | Status: AC
Start: 2018-03-29 — End: ?
  Filled 2018-03-29: qty 20

## 2018-03-29 MED ORDER — ROCURONIUM BROMIDE 10 MG/ML (PF) SYRINGE
PREFILLED_SYRINGE | INTRAVENOUS | Status: DC | PRN
  Administered 2018-03-29: 10 mg via INTRAVENOUS
  Administered 2018-03-29: 30 mg via INTRAVENOUS
  Administered 2018-03-29: 10 mg via INTRAVENOUS

## 2018-03-29 MED ORDER — SUGAMMADEX SODIUM 200 MG/2ML IV SOLN
INTRAVENOUS | Status: DC | PRN
  Administered 2018-03-29: 350 mg via INTRAVENOUS

## 2018-03-29 MED ORDER — FENTANYL CITRATE (PF) 100 MCG/2ML IJ SOLN
INTRAMUSCULAR | Status: AC
  Filled 2018-03-29: qty 2

## 2018-03-29 MED ORDER — SODIUM CHLORIDE 0.9 % IV SOLN: INTRAVENOUS | Status: DC

## 2018-03-29 MED ORDER — PROMETHAZINE HCL 25 MG/ML IJ SOLN: 6.2500 mg | INTRAMUSCULAR | Status: DC | PRN

## 2018-03-29 MED ORDER — DEXAMETHASONE SODIUM PHOSPHATE 10 MG/ML IJ SOLN
INTRAMUSCULAR | Status: DC | PRN
  Administered 2018-03-29: 4 mg via INTRAVENOUS

## 2018-03-29 MED ORDER — MIDAZOLAM HCL 2 MG/2ML IJ SOLN
INTRAMUSCULAR | Status: DC | PRN
  Administered 2018-03-29: 2 mg via INTRAVENOUS

## 2018-03-29 SURGICAL SUPPLY — 14 items

## 2018-03-29 NOTE — Transfer of Care (Signed)
Immediate Anesthesia Transfer of Care Note  Patient: Timothy Hoover  Procedure(s) Performed: endoscopy (N/A ) FOREIGN BODY REMOVAL  Patient Location: PACU  Anesthesia Type:General  Level of Consciousness: awake, alert  and oriented  Airway & Oxygen Therapy: Patient Spontanous Breathing and Patient connected to face mask oxygen  Post-op Assessment: Report given to RN and Post -op Vital signs reviewed and stable  Post vital signs: Reviewed and stable  Last Vitals:  Vitals Value Taken Time  BP 153/86 03/29/2018  3:32 PM  Temp    Pulse 61 03/29/2018  3:34 PM  Resp 10 03/29/2018  3:34 PM  SpO2 100 % 03/29/2018  3:34 PM  Vitals shown include unvalidated device data.  Last Pain:  Vitals:   03/29/18 1422  TempSrc: Oral  PainSc: 0-No pain         Complications: No apparent anesthesia complications

## 2018-03-29 NOTE — Op Note (Signed)
[facility] Patient Name: Timothy Hoover Procedure Date: 03/29/2018 MRN: [id number] Attending MD: Nancy Fetter Dr., MD Date of Birth: [birth year] CSN: [id number] Age: 62 Admit Type: Emergency Department Procedure:                Upper GI endoscopy and removal of food impacted in                            the esophagus Indications:              Foreign body in the esophagus, patient ate a hot                            dog about 24 hours ago and has been unable to                            swallow since Providers:                Jeneen Rinks L. Brittanyann Wittner Dr., MD, Cleda Daub, RN,                            Laverda Sorenson, Technician, Leandro Reasoner, CRNA Referring MD:              Medicines:                Monitored Anesthesia Care, due to the history the                            patient was intubated prior to the procedure Complications:            No immediate complications. Estimated Blood Loss:     Estimated blood loss: none. Procedure:                Pre-Anesthesia Assessment:                           - Prior to the procedure, a History and Physical                            was performed, and patient medications and                            allergies were reviewed. The patient's tolerance of                            previous anesthesia was also reviewed. The risks                            and benefits of the procedure and the sedation                            options and risks were discussed with the patient.                            All questions were answered, and informed consent  was obtained. Prior Anticoagulants: The patient has                            taken no previous anticoagulant or antiplatelet                            agents. ASA Grade Assessment: II - A patient with                            mild systemic disease. After reviewing the risks                            and benefits, the patient was deemed in                         satisfactory condition to undergo the procedure.                           After obtaining informed consent, the endoscope was                            passed under direct vision. Throughout the                            procedure, the patient's blood pressure, pulse, and                            oxygen saturations were monitored continuously. The                            GIF-H190 ([id number] Olympus adult endoscope was                            introduced through the mouth, and advanced to the                            second part of duodenum. The upper GI endoscopy was                            technically difficult and complex due to presence                            of food. The patient tolerated the procedure fairly                            well. Scope In: Scope Out: Findings:      Food was found in the entire esophagus. This was impacted throughout the       esophagus. Multiple passes of the scope were made with the rescue net       and Quadra pod retriever used to break up the food material in we were       able to pull out pieces of it. It was soft and mushy and came apart.       Approximately half was removed and  after this we were able to break up       the other pieces and push into the stomach. Upon removal of the scope       after all the food material had been removed there were small pieces       seen up in the oropharynx around the ET tube that were grabbed with a       biopsy forceps and pulled out.      Mildly severe esophagitis with no bleeding was found at the       gastroesophageal junction.      The stomach was normal.      The examined duodenum was normal. Impression:               - Food in the esophagus. The food was impacting                            throughout the esophagus and extensive passage of                            the scope was required to remove. It was quite soft                            and mushy.                            - Mildly severe esophagitis.                           - Normal stomach.                           - Normal examined duodenum.                           - No specimens collected. Moderate Sedation:      See anesthesia note, no moderate sedation. Recommendation:           - Discharge patient to home (ambulatory).                           - Clear liquid diet for 1 day.                           - Use Prilosec (omeprazole) 20 mg PO daily.                           - Return to endoscopist in 1 month. Procedure Code(s):        --- Professional ---                           [phone], Esophagogastroduodenoscopy, flexible,                            transoral; diagnostic, including collection of                            specimen(s) by brushing or washing, when performed                            (  separate procedure) Diagnosis Code(s):        --- Professional ---                           J19.417E, Food in esophagus causing other injury,                            initial encounter                           T18.108A, Unspecified foreign body in esophagus                            causing other injury, initial encounter                           K20.9, Esophagitis, unspecified CPT copyright 2018 [employer]. All rights reserved. The codes documented in this report are preliminary and upon coder review may  be revised to meet current compliance requirements. Nancy Fetter Dr., MD 03/29/2018 3:32:00 PM This report has been signed electronically. Number of Addenda: 0

## 2018-03-29 NOTE — Consult Note (Signed)
[place] Reason for consult: Foreign body in the esophagus Referring Physician: ER.  PCP: Dr. Deforest Hoyles.  Primary GI: Dr. Winnifred Friar is an 62 y.o. male.  HPI:  Patient had been doing well.  He notes that he very often has sensation of food sticking in his lower esophagus usually solid food that will eventually pass.  He was at [employer] game yesterday and around 2 PM ate a hot dog.  He is been unable to swallow since then and felt the discomfort in the subxiphoid area.  He has been sitting up most of the night they gave him some water in the ER and he regurgitated this back up.  He has been regurgitating his saliva.  Empiric trial of glucagon has not really helped.  He has not had an EGD but has had previous colonoscopies.  Denies any allergies or any problems with chronic reflux.  He will take a couple of Tums every few months.  Past Medical History:  Diagnosis Date  . Hypertension     Past Surgical History:  Procedure Laterality Date  . COLONOSCOPY WITH PROPOFOL N/A 10/31/2015   Procedure: COLONOSCOPY WITH PROPOFOL;  Surgeon: Garlan Fair, MD;  Location: [facility];  Service: Endoscopy;  Laterality: N/A;  . HERNIA REPAIR    . RHINOPLASTY      No family history on file.  Social History:  reports that he has never smoked. He has never used smokeless tobacco. He reports that he drinks alcohol. He reports that he does not use drugs.  Allergies: No Known Allergies  Medications; Prior to Admission medications   Medication Sig Start Date End Date Taking? Authorizing Provider  ramipril (ALTACE) 5 MG capsule Take 5 mg by mouth daily.   Yes [provider]  sildenafil (REVATIO) 20 MG tablet Take 20 mg by mouth daily. 02/09/18  Yes [provider]  simvastatin (ZOCOR) 20 MG tablet Take 20 mg by mouth daily. 12/31/17  Yes [provider]    PRN Meds  No results found for this or any previous visit (from the  past 48 hour(s)).  No results found.             Blood pressure (!) 149/94, pulse (!) 54, temperature 98.2 F (36.8 C), resp. rate 18, height 6' (1.829 m), weight 83.9 kg, SpO2 98 %.  Physical exam:   General--white male in no acute distress ENT--nonicteric Neck--supple no masses are palpated Heart--regular rate and rhythm without murmurs or gallops Lungs--clear Abdomen--soft and nontender Psych--normal mood and affect, alert and oriented   Assessment: 1.  Esophageal obstruction secondary to hot dog  Plan: We will plan on going ahead later today with EGD and removal of hotdog.  Have discussed this with the patient including the risk and benefits.  The possibility of bleeding perforation failure to remove the hot dog etc. have all been discussed.   Nancy Fetter 03/29/2018, 1:59 PM   This note was created using voice recognition software and minor errors may Have occurred unintentionally. Pager: [phone] If no answer or after hours call [phone]

## 2018-03-29 NOTE — ED Triage Notes (Signed)
Pt reports was at ball game yesterday and got choked on hotdog. Did have to vomit. Reports that since he isnt able to swallow and liquids or foods. Pt is able to swallow saliva.

## 2018-03-29 NOTE — Discharge Instructions (Addendum)
YOU HAD AN ENDOSCOPIC PROCEDURE TODAY: Refer to the procedure report and other information in the discharge instructions given to you for any specific questions about what was found during the examination. If this information does not answer your questions, please call [facility] office at [phone] to clarify.   YOU SHOULD EXPECT: Some feelings of bloating in the abdomen. Passage of more gas than usual. Walking can help get rid of the air that was put into your GI tract during the procedure and reduce the bloating. If you had a lower endoscopy (such as a colonoscopy or flexible sigmoidoscopy) you may notice spotting of blood in your stool or on the toilet paper. Some abdominal soreness may be present for a day or two, also.  DIET: Your first meal following the procedure should be a light meal and then it is ok to progress to your normal diet. A half-sandwich or bowl of soup is an example of a good first meal. Heavy or fried foods are harder to digest and may make you feel nauseous or bloated. Drink plenty of fluids but you should avoid alcoholic beverages for 24 hours. If you had a esophageal dilation, please see attached instructions for diet.   ACTIVITY: Your care partner should take you home directly after the procedure. You should plan to take it easy, moving slowly for the rest of the day. You can resume normal activity the day after the procedure however YOU SHOULD NOT DRIVE, use power tools, machinery or perform tasks that involve climbing or major physical exertion for 24 hours (because of the sedation medicines used during the test).   SYMPTOMS TO REPORT IMMEDIATELY: A gastroenterologist can be reached at any hour. Please call [phone]  for any of the following symptoms:   Following lower endoscopy (colonoscopy, flexible sigmoidoscopy) Excessive amounts of blood in the stool  Significant tenderness, worsening of abdominal pains  Swelling of the abdomen that is new, acute  Fever of 100  or higher   Following upper endoscopy (EGD, EUS, ERCP, esophageal dilation) Vomiting of blood or coffee ground material  New, significant abdominal pain  New, significant chest pain or pain under the shoulder blades  Painful or persistently difficult swallowing  New shortness of breath  Black, tarry-looking or red, bloody stools  FOLLOW UP:  If any biopsies were taken you will be contacted by phone or by letter within the next 1-3 weeks. Call [phone]  if you have not heard about the biopsies in 3 weeks.  Please also call with any specific questions about appointments or follow up tests. Clear liquids  Only for at least 1 day  .jl

## 2018-03-29 NOTE — Anesthesia Postprocedure Evaluation (Signed)
Anesthesia Post Note  Patient: Timothy Hoover  Procedure(s) Performed: endoscopy (N/A ) FOREIGN BODY REMOVAL     Patient location during evaluation: PACU Anesthesia Type: General Level of consciousness: awake and alert Pain management: pain level controlled Vital Signs Assessment: post-procedure vital signs reviewed and stable Respiratory status: spontaneous breathing, nonlabored ventilation, respiratory function stable and patient connected to nasal cannula oxygen Cardiovascular status: blood pressure returned to baseline and stable Postop Assessment: no apparent nausea or vomiting Anesthetic complications: no    Last Vitals:  Vitals:   03/29/18 1535 03/29/18 1600  BP: (!) 153/86 (!) 145/83  Pulse: (!) 58   Resp: 17   Temp: 37.2 C   SpO2: 100%     Last Pain:  Vitals:   03/29/18 1545  TempSrc:   PainSc: 0-No pain                 Rikki Smestad S

## 2018-03-29 NOTE — ED Provider Notes (Signed)
[facility] DEPT Provider Note   CSN: [id number] Arrival date & time: 03/29/18  0755     History   Chief Complaint Chief Complaint  Patient presents with  . food stuck in throat    HPI Timothy Hoover is a 62 y.o. male.  Patient reports he was eating a hot dog at a football game yesterday 2 PM and feels as if he has food stuck in his esophagus.  Points to sub-xiphoid area.  He is presently asymptomatic and has no foreign body sensation however he reports that within a minute or 2 after drinking he vomits.  Denies difficulty breathing denies change in his voice.  No treatment prior to coming here.  He has not eaten anything since.  He is able to swallow his own saliva.  Had similar sensation in the past, with food being transiently being stuck in his esophagus however he is always been able to pass food into his stomach after a few minutes.   HPI  Past Medical History:  Diagnosis Date  . Hypertension     There are no active problems to display for this patient.   Past Surgical History:  Procedure Laterality Date  . COLONOSCOPY WITH PROPOFOL N/A 10/31/2015   Procedure: COLONOSCOPY WITH PROPOFOL;  Surgeon: Garlan Fair, MD;  Location: [facility];  Service: Endoscopy;  Laterality: N/A;  . HERNIA REPAIR    . RHINOPLASTY          Home Medications    Prior to Admission medications   Medication Sig Start Date End Date Taking? Authorizing Provider  ramipril (ALTACE) 5 MG capsule Take 5 mg by mouth daily.   Yes [provider]  sildenafil (REVATIO) 20 MG tablet Take 20 mg by mouth daily. 02/09/18  Yes [provider]  simvastatin (ZOCOR) 20 MG tablet Take 20 mg by mouth daily. 12/31/17  Yes [provider]    Family History No family history on file.  Social History Social History   Tobacco Use  . Smoking status: Never Smoker  . Smokeless tobacco: Never Used  Substance Use Topics  . Alcohol use: Yes   Comment: occasional  . Drug use: No     Allergies   Patient has no known allergies.   Review of Systems Review of Systems  Constitutional: Negative.   HENT: Negative.   Respiratory: Negative.   Cardiovascular: Positive for chest pain.       Syncope  Gastrointestinal: Negative.   Endocrine: Positive for polyphagia.  Musculoskeletal: Negative.   Skin: Negative.   Allergic/Immunologic: Negative.   Neurological: Negative.   Psychiatric/Behavioral: Negative.   All other systems reviewed and are negative.    Physical Exam Updated Vital Signs BP (!) 181/85   Pulse 66   Temp 98.2 F (36.8 C)   Resp 17   Ht 6' (1.829 m)   Wt 83.9 kg   SpO2 99%   BMI 25.09 kg/m   Physical Exam  Constitutional: He appears well-developed and well-nourished. No distress.  HENT:  Head: Normocephalic and atraumatic.  Mouth/Throat: Oropharynx is clear and moist.  Handling secretions well  Eyes: Pupils are equal, round, and reactive to light. Conjunctivae are normal.  Neck: Neck supple. No tracheal deviation present. No thyromegaly present.  No bruit  Cardiovascular: Normal rate and regular rhythm.  No murmur heard. Pulmonary/Chest: Effort normal and breath sounds normal.  Abdominal: Soft. Bowel sounds are normal. He exhibits no distension. There is no tenderness.  Musculoskeletal: Normal range  of motion. He exhibits no edema or tenderness.  Neurological: He is alert. Coordination normal.  Skin: Skin is warm and dry. No rash noted.  Psychiatric: He has a normal mood and affect.  Nursing note and vitals reviewed.    ED Treatments / Results  Labs (all labs ordered are listed, but only abnormal results are displayed) Labs Reviewed - No data to display  EKG None  Radiology No results found.  Procedures Procedures (including critical care time)  Medications Ordered in ED Medications - No data to display   Initial Impression / Assessment and Plan / ED Course  I have reviewed  the triage vital signs and the nursing notes.  Pertinent labs & imaging results that were available during my care of the patient were reviewed by me and considered in my medical decision making (see chart for details).     Administered intravenous glucagon.  Tried to drink water after glucagon administered.  Patient vomited almost immediately.  Dr. Oletta Lamas from gastroenterology service consulted will arrange for endoscopy  Final Clinical Impressions(s) / ED Diagnoses  Diagnosis esophageal obstruction due to food impaction Final diagnoses:  None    ED Discharge Orders    None       Orlie Dakin, MD 03/29/18 1027

## 2018-03-29 NOTE — Interval H&P Note (Signed)
History and Physical Interval Note:  03/29/2018 2:26 PM  Timothy Hoover  has presented today for surgery, with the diagnosis of food impaction  The various methods of treatment have been discussed with the patient and family. After consideration of risks, benefits and other options for treatment, the patient has consented to  Procedure(s): endoscopy (N/A) as a surgical intervention .  The patient's history has been reviewed, patient examined, no change in status, stable for surgery.  I have reviewed the patient's chart and labs.  Questions were answered to the patient's satisfaction.     Nancy Fetter

## 2018-03-29 NOTE — Anesthesia Preprocedure Evaluation (Signed)
Anesthesia Evaluation  Patient identified by MRN, date of birth, ID band Patient awake    Reviewed: Allergy & Precautions, NPO status , Patient's Chart, lab work & pertinent test results  Airway Mallampati: II  TM Distance: >3 FB Neck ROM: Full    Dental no notable dental hx.    Pulmonary neg pulmonary ROS,    Pulmonary exam normal breath sounds clear to auscultation       Cardiovascular hypertension, Normal cardiovascular exam Rhythm:Regular Rate:Normal     Neuro/Psych negative neurological ROS  negative psych ROS   GI/Hepatic negative GI ROS, Neg liver ROS,   Endo/Other  negative endocrine ROS  Renal/GU negative Renal ROS  negative genitourinary   Musculoskeletal negative musculoskeletal ROS (+)   Abdominal   Peds negative pediatric ROS (+)  Hematology negative hematology ROS (+)   Anesthesia Other Findings   Reproductive/Obstetrics negative OB ROS                             Anesthesia Physical Anesthesia Plan  ASA: II and emergent  Anesthesia Plan: General   Post-op Pain Management:    Induction: Intravenous, Rapid sequence and Cricoid pressure planned  PONV Risk Score and Plan: 1 and Ondansetron and Treatment may vary due to age or medical condition  Airway Management Planned: Oral ETT  Additional Equipment:   Intra-op Plan:   Post-operative Plan: Extubation in OR  Informed Consent: I have reviewed the patients History and Physical, chart, labs and discussed the procedure including the risks, benefits and alternatives for the proposed anesthesia with the patient or authorized representative who has indicated his/her understanding and acceptance.   Dental advisory given  Plan Discussed with: CRNA and Surgeon  Anesthesia Plan Comments:         Anesthesia Quick Evaluation

## 2018-03-29 NOTE — Progress Notes (Signed)
PACU Nursing Note: DC note, pt alert and oriented, vss, pt voices no complaints, speech wnl, able to swallow without difficulty, tolerating PO fluids of water well, no signs or complaints of nausea noted. DC instructions provided to both pt and wife. Pt teaching done re: general anesthesia rec for procedure and post op general anesthesia DC instructions of advancing diet slowly, per GI MD to start with clear liquids x 24 hours then advance to full liquids. Also discussed signs of when to call GI MD or return to the ED. Copy of DC instructions provided to pt and wife. Opportunity for questions provided. Pt escorted to exit via wheelchair. Prior to leaving offered opportunity for questions again.

## 2018-03-29 NOTE — Anesthesia Procedure Notes (Signed)
Procedure Name: Intubation Date/Time: 03/29/2018 2:32 PM Performed by: Niel Hummer, CRNA Pre-anesthesia Checklist: Patient identified, Emergency Drugs available, Suction available and Patient being monitored Patient Re-evaluated:Patient Re-evaluated prior to induction Oxygen Delivery Method: Circle system utilized Preoxygenation: Pre-oxygenation with 100% oxygen Induction Type: IV induction and Rapid sequence Laryngoscope Size: Mac and 4 Grade View: Grade III Tube type: Oral Tube size: 7.5 mm Number of attempts: 1 Airway Equipment and Method: Bougie stylet Placement Confirmation: ETT inserted through vocal cords under direct vision,  positive ETCO2 and breath sounds checked- equal and bilateral Secured at: 23 cm Tube secured with: Tape Dental Injury: Teeth and Oropharynx as per pre-operative assessment  Comments: Grade 3 view, with very slight visualization of epiglottis Bougie stylet used and tubed passed without issue.

## 2018-03-29 NOTE — H&P (View-Only) (Signed)
[place] Reason for consult: Foreign body in the esophagus Referring Physician: ER.  PCP: Dr. Deforest Hoyles.  Primary GI: Timothy Hoover is an 62 y.o. male.  HPI:  Patient had been doing well.  Timothy Hoover notes that Timothy Hoover very often has sensation of food sticking in his lower esophagus usually solid food that will eventually pass.  Timothy Hoover was at [place] game yesterday and around 2 PM ate a hot dog.  Timothy Hoover is been unable to swallow since then and felt the discomfort in the subxiphoid area.  Timothy Hoover has been sitting up most of the night they gave him some water in the ER and Timothy Hoover regurgitated this back up.  Timothy Hoover has been regurgitating his saliva.  Empiric trial of glucagon has not really helped.  Timothy Hoover has not had an EGD but has had previous colonoscopies.  Denies any allergies or any problems with chronic reflux.  Timothy Hoover will take a couple of Tums every few months.  Past Medical History:  Diagnosis Date  . Hypertension     Past Surgical History:  Procedure Laterality Date  . COLONOSCOPY WITH PROPOFOL N/A 10/31/2015   Procedure: COLONOSCOPY WITH PROPOFOL;  Surgeon: Garlan Fair, MD;  Location: [facility];  Service: Endoscopy;  Laterality: N/A;  . HERNIA REPAIR    . RHINOPLASTY      No family history on file.  Social History:  reports that Timothy Hoover has never smoked. Timothy Hoover has never used smokeless tobacco. Timothy Hoover reports that Timothy Hoover drinks alcohol. Timothy Hoover reports that Timothy Hoover does not use drugs.  Allergies: No Known Allergies  Medications; Prior to Admission medications   Medication Sig Start Date End Date Taking? Authorizing Provider  ramipril (ALTACE) 5 MG capsule Take 5 mg by mouth daily.   Yes [provider]  sildenafil (REVATIO) 20 MG tablet Take 20 mg by mouth daily. 02/09/18  Yes [provider]  simvastatin (ZOCOR) 20 MG tablet Take 20 mg by mouth daily. 12/31/17  Yes [provider]    PRN Meds  No results found for this or any previous visit (from the  past 48 hour(s)).  No results found.             Blood pressure (!) 149/94, pulse (!) 54, temperature 98.2 F (36.8 C), resp. rate 18, height 6' (1.829 m), weight 83.9 kg, SpO2 98 %.  Physical exam:   General--white male in no acute distress ENT--nonicteric Neck--supple no masses are palpated Heart--regular rate and rhythm without murmurs or gallops Lungs--clear Abdomen--soft and nontender Psych--normal mood and affect, alert and oriented   Assessment: 1.  Esophageal obstruction secondary to hot dog  Plan: We will plan on going ahead later today with EGD and removal of hotdog.  Have discussed this with the patient including the risk and benefits.  The possibility of bleeding perforation failure to remove the hot dog etc. have all been discussed.   Nancy Fetter 03/29/2018, 1:59 PM   This note was created using voice recognition software and minor errors may Have occurred unintentionally. Pager: [phone] If no answer or after hours call [phone]

## 2018-03-30 ENCOUNTER — Encounter (HOSPITAL_COMMUNITY): Payer: Self-pay | Admitting: Gastroenterology

## 2018-04-08 ENCOUNTER — Other Ambulatory Visit: Payer: Self-pay | Admitting: Gastroenterology

## 2018-04-08 DIAGNOSIS — K2210 Ulcer of esophagus without bleeding: Secondary | ICD-10-CM

## 2018-04-08 DIAGNOSIS — K121 Other forms of stomatitis: Secondary | ICD-10-CM | POA: Diagnosis not present

## 2018-04-08 MED FILL — AMOXICILLIN 500 MG CAPSULE: 500 | 4 days supply | Qty: 16 | Fill #0

## 2018-04-08 MED FILL — LIDO2%VS/PEPTO/DIPHEN 1:1:1: 6 days supply | Qty: 240 | Fill #0

## 2018-04-09 MED FILL — SILDENAFIL CITRATE 20 MG TA: 20 | 25 days supply | Qty: 50 | Fill #1

## 2018-04-16 MED FILL — valACYclovir HCL 1 GM TABS: 1 | 2 days supply | Qty: 4 | Fill #1

## 2018-04-20 MED FILL — SIMVASTATIN 20 MG TABLET: 20 | 30 days supply | Qty: 30 | Fill #0

## 2018-05-01 MED FILL — valACYclovir HCL 1 GM TABS: 1 | 2 days supply | Qty: 4 | Fill #2

## 2018-05-07 MED FILL — HYDROCODON-APAP 7.5-325: 7.5-325 | 1 days supply | Qty: 6 | Fill #0

## 2018-05-07 MED FILL — AZITHROMYCIN 500 MG TABLET: 500 | 3 days supply | Qty: 3 | Fill #0

## 2018-05-15 MED FILL — RAMIPRIL 5 MG CAPS: 5 | 90 days supply | Qty: 90 | Fill #2

## 2018-05-20 ENCOUNTER — Other Ambulatory Visit: Payer: Self-pay | Admitting: Gastroenterology

## 2018-05-20 ENCOUNTER — Ambulatory Visit
Admission: RE | Admit: 2018-05-20 | Discharge: 2018-05-20 | Disposition: A | Discharge status: Home / Self Care | Payer: 59 | Source: Ambulatory Visit | Attending: Gastroenterology | Admitting: Gastroenterology

## 2018-05-20 DIAGNOSIS — K2210 Ulcer of esophagus without bleeding: Secondary | ICD-10-CM | POA: Diagnosis not present

## 2018-05-22 DIAGNOSIS — D225 Melanocytic nevi of trunk: Secondary | ICD-10-CM | POA: Diagnosis not present

## 2018-05-22 DIAGNOSIS — Z85828 Personal history of other malignant neoplasm of skin: Secondary | ICD-10-CM | POA: Diagnosis not present

## 2018-05-22 DIAGNOSIS — C44319 Basal cell carcinoma of skin of other parts of face: Secondary | ICD-10-CM | POA: Diagnosis not present

## 2018-05-22 DIAGNOSIS — C44311 Basal cell carcinoma of skin of nose: Secondary | ICD-10-CM | POA: Diagnosis not present

## 2018-05-22 DIAGNOSIS — L918 Other hypertrophic disorders of the skin: Secondary | ICD-10-CM | POA: Diagnosis not present

## 2018-05-22 DIAGNOSIS — L814 Other melanin hyperpigmentation: Secondary | ICD-10-CM | POA: Diagnosis not present

## 2018-05-22 DIAGNOSIS — L819 Disorder of pigmentation, unspecified: Secondary | ICD-10-CM | POA: Diagnosis not present

## 2018-05-22 DIAGNOSIS — L820 Inflamed seborrheic keratosis: Secondary | ICD-10-CM | POA: Diagnosis not present

## 2018-05-22 DIAGNOSIS — D485 Neoplasm of uncertain behavior of skin: Secondary | ICD-10-CM | POA: Diagnosis not present

## 2018-05-22 DIAGNOSIS — L821 Other seborrheic keratosis: Secondary | ICD-10-CM | POA: Diagnosis not present

## 2018-05-22 DIAGNOSIS — L570 Actinic keratosis: Secondary | ICD-10-CM | POA: Diagnosis not present

## 2018-05-29 MED FILL — SIMVASTATIN 20 MG TABLET: 20 | 30 days supply | Qty: 30 | Fill #1

## 2018-06-09 DIAGNOSIS — T18128S Food in esophagus causing other injury, sequela: Secondary | ICD-10-CM | POA: Diagnosis not present

## 2018-06-09 DIAGNOSIS — K2210 Ulcer of esophagus without bleeding: Secondary | ICD-10-CM | POA: Diagnosis not present

## 2018-06-10 DIAGNOSIS — Z85828 Personal history of other malignant neoplasm of skin: Secondary | ICD-10-CM | POA: Diagnosis not present

## 2018-06-10 DIAGNOSIS — C44311 Basal cell carcinoma of skin of nose: Secondary | ICD-10-CM | POA: Diagnosis not present

## 2018-06-10 MED FILL — SILDENAFIL CITRATE 20 MG TA: 20 | 25 days supply | Qty: 50 | Fill #2

## 2018-07-01 MED FILL — SIMVASTATIN 20 MG TABLET: 20 | 90 days supply | Qty: 90 | Fill #2

## 2018-08-10 MED FILL — RAMIPRIL 5 MG CAPS: 5 | 90 days supply | Qty: 90 | Fill #3

## 2018-08-24 DIAGNOSIS — E782 Mixed hyperlipidemia: Secondary | ICD-10-CM | POA: Diagnosis not present

## 2018-08-24 DIAGNOSIS — I10 Essential (primary) hypertension: Secondary | ICD-10-CM | POA: Diagnosis not present

## 2018-08-24 DIAGNOSIS — N182 Chronic kidney disease, stage 2 (mild): Secondary | ICD-10-CM | POA: Diagnosis not present

## 2018-08-24 MED FILL — SIMVASTATIN 20 MG TABLET: 20 | 90 days supply | Qty: 90 | Fill #0

## 2018-08-27 ENCOUNTER — Observation Stay (HOSPITAL_COMMUNITY): Payer: 59 | Admitting: Anesthesiology

## 2018-08-27 ENCOUNTER — Encounter (HOSPITAL_COMMUNITY): Payer: Self-pay

## 2018-08-27 ENCOUNTER — Emergency Department (HOSPITAL_COMMUNITY): Payer: 59

## 2018-08-27 ENCOUNTER — Observation Stay (HOSPITAL_COMMUNITY)
Admission: EM | Admit: 2018-08-27 | Discharge: 2018-08-27 | Disposition: A | Discharge status: Home / Self Care | Payer: 59 | Attending: Surgery | Admitting: Surgery

## 2018-08-27 ENCOUNTER — Encounter (HOSPITAL_COMMUNITY)
Admission: EM | Disposition: A | Discharge status: Home / Self Care | Payer: Self-pay | Source: Home / Self Care | Attending: Emergency Medicine

## 2018-08-27 ENCOUNTER — Other Ambulatory Visit: Payer: Self-pay

## 2018-08-27 DIAGNOSIS — D3501 Benign neoplasm of right adrenal gland: Secondary | ICD-10-CM | POA: Insufficient documentation

## 2018-08-27 DIAGNOSIS — R1031 Right lower quadrant pain: Secondary | ICD-10-CM

## 2018-08-27 DIAGNOSIS — K3580 Unspecified acute appendicitis: Secondary | ICD-10-CM | POA: Diagnosis present

## 2018-08-27 DIAGNOSIS — E785 Hyperlipidemia, unspecified: Secondary | ICD-10-CM | POA: Diagnosis not present

## 2018-08-27 DIAGNOSIS — Z79899 Other long term (current) drug therapy: Secondary | ICD-10-CM | POA: Diagnosis not present

## 2018-08-27 DIAGNOSIS — C7A8 Other malignant neuroendocrine tumors: Secondary | ICD-10-CM | POA: Diagnosis not present

## 2018-08-27 DIAGNOSIS — I10 Essential (primary) hypertension: Secondary | ICD-10-CM | POA: Diagnosis not present

## 2018-08-27 DIAGNOSIS — K862 Cyst of pancreas: Secondary | ICD-10-CM | POA: Insufficient documentation

## 2018-08-27 DIAGNOSIS — K3532 Acute appendicitis with perforation and localized peritonitis, without abscess: Secondary | ICD-10-CM | POA: Diagnosis not present

## 2018-08-27 DIAGNOSIS — D3A020 Benign carcinoid tumor of the appendix: Secondary | ICD-10-CM | POA: Diagnosis not present

## 2018-08-27 DIAGNOSIS — Z1159 Encounter for screening for other viral diseases: Secondary | ICD-10-CM | POA: Diagnosis not present

## 2018-08-27 DIAGNOSIS — Z20828 Contact with and (suspected) exposure to other viral communicable diseases: Secondary | ICD-10-CM | POA: Diagnosis not present

## 2018-08-27 HISTORY — PX: LAPAROSCOPIC APPENDECTOMY: SHX408

## 2018-08-27 LAB — CBC WITH DIFFERENTIAL/PLATELET
Abs Immature Granulocytes: 0.06 10*3/uL (ref 0.00–0.07)
Basophils Absolute: 0 10*3/uL (ref 0.0–0.1)
Basophils Relative: 0 %
Eosinophils Absolute: 0.1 10*3/uL (ref 0.0–0.5)
Eosinophils Relative: 1 %
HCT: 41.3 % (ref 39.0–52.0)
Hemoglobin: 14.3 g/dL (ref 13.0–17.0)
Immature Granulocytes: 1 %
Lymphocytes Relative: 14 %
Lymphs Abs: 1.6 10*3/uL (ref 0.7–4.0)
MCH: 31.8 pg (ref 26.0–34.0)
MCHC: 34.6 g/dL (ref 30.0–36.0)
MCV: 92 fL (ref 80.0–100.0)
Monocytes Absolute: 1.2 10*3/uL — ABNORMAL HIGH (ref 0.1–1.0)
Monocytes Relative: 10 %
Neutro Abs: 8.8 10*3/uL — ABNORMAL HIGH (ref 1.7–7.7)
Neutrophils Relative %: 74 %
Platelets: 160 10*3/uL (ref 150–400)
RBC: 4.49 MIL/uL (ref 4.22–5.81)
RDW: 11.8 % (ref 11.5–15.5)
WBC: 11.8 10*3/uL — ABNORMAL HIGH (ref 4.0–10.5)
nRBC: 0 % (ref 0.0–0.2)

## 2018-08-27 LAB — URINALYSIS, ROUTINE W REFLEX MICROSCOPIC
Bilirubin Urine: NEGATIVE
Glucose, UA: NEGATIVE mg/dL
Hgb urine dipstick: NEGATIVE
Ketones, ur: NEGATIVE mg/dL
Leukocytes,Ua: NEGATIVE
Nitrite: NEGATIVE
Protein, ur: NEGATIVE mg/dL
Specific Gravity, Urine: 1.024 (ref 1.005–1.030)
pH: 5 (ref 5.0–8.0)

## 2018-08-27 LAB — COMPREHENSIVE METABOLIC PANEL
ALT: 19 U/L (ref 0–44)
AST: 17 U/L (ref 15–41)
Albumin: 4.7 g/dL (ref 3.5–5.0)
Alkaline Phosphatase: 76 U/L (ref 38–126)
Anion gap: 8 (ref 5–15)
BUN: 18 mg/dL (ref 6–20)
CO2: 22 mmol/L (ref 22–32)
Calcium: 9 mg/dL (ref 8.9–10.3)
Chloride: 107 mmol/L (ref 98–111)
Creatinine, Ser: 1.01 mg/dL (ref 0.61–1.24)
GFR calc Af Amer: >60 mL/min (ref >60)
GFR calc non Af Amer: >60 mL/min (ref >60)
Glucose, Bld: 130 mg/dL — ABNORMAL HIGH (ref 70–99)
Potassium: 3.8 mmol/L (ref 3.5–5.1)
Sodium: 137 mmol/L (ref 135–145)
Total Bilirubin: 1.8 mg/dL — ABNORMAL HIGH (ref 0.3–1.2)
Total Protein: 7.3 g/dL (ref 6.5–8.1)

## 2018-08-27 LAB — LIPASE, BLOOD: Lipase: 30 U/L (ref 11–51)

## 2018-08-27 LAB — SARS CORONAVIRUS 2 BY RT PCR (HOSPITAL ORDER, PERFORMED IN ~~LOC~~ HOSPITAL LAB): SARS Coronavirus 2: NEGATIVE

## 2018-08-27 SURGERY — APPENDECTOMY, LAPAROSCOPIC
Anesthesia: General | Site: Abdomen

## 2018-08-27 MED ORDER — EPHEDRINE SULFATE-NACL 50-0.9 MG/10ML-% IV SOSY
PREFILLED_SYRINGE | INTRAVENOUS | Status: DC | PRN
  Administered 2018-08-27 (×2): 10 mg via INTRAVENOUS

## 2018-08-27 MED ORDER — MIDAZOLAM HCL 2 MG/2ML IJ SOLN
INTRAMUSCULAR | Status: AC
  Filled 2018-08-27: qty 2

## 2018-08-27 MED ORDER — PIPERACILLIN-TAZOBACTAM 3.375 G IVPB 30 MIN
3.3750 g | Freq: Once | INTRAVENOUS | Status: AC
  Administered 2018-08-27: 08:00:00 3.375 g via INTRAVENOUS
  Filled 2018-08-27: qty 50

## 2018-08-27 MED ORDER — SUGAMMADEX SODIUM 200 MG/2ML IV SOLN
INTRAVENOUS | Status: AC
  Filled 2018-08-27: qty 2

## 2018-08-27 MED ORDER — FENTANYL CITRATE (PF) 100 MCG/2ML IJ SOLN
INTRAMUSCULAR | Status: AC
  Filled 2018-08-27: qty 2

## 2018-08-27 MED ORDER — DEXTROSE-NACL 5-0.9 % IV SOLN: INTRAVENOUS | Status: DC

## 2018-08-27 MED ORDER — ACETAMINOPHEN 500 MG PO TABS: ORAL_TABLET | ORAL | 0 refills | Status: DC

## 2018-08-27 MED ORDER — FENTANYL CITRATE (PF) 100 MCG/2ML IJ SOLN
INTRAMUSCULAR | Status: AC
  Filled 2018-08-27: qty 4

## 2018-08-27 MED ORDER — METOCLOPRAMIDE HCL 5 MG/ML IJ SOLN: 10.0000 mg | Freq: Once | INTRAMUSCULAR | Status: DC | PRN

## 2018-08-27 MED ORDER — ONDANSETRON HCL 4 MG/2ML IJ SOLN
INTRAMUSCULAR | Status: AC
  Filled 2018-08-27: qty 2

## 2018-08-27 MED ORDER — IBUPROFEN 200 MG PO TABS: ORAL_TABLET | ORAL | Status: AC

## 2018-08-27 MED ORDER — DEXAMETHASONE SODIUM PHOSPHATE 10 MG/ML IJ SOLN
INTRAMUSCULAR | Status: AC
  Filled 2018-08-27: qty 1

## 2018-08-27 MED ORDER — MORPHINE SULFATE (PF) 4 MG/ML IV SOLN
4.0000 mg | Freq: Once | INTRAVENOUS | Status: AC
  Administered 2018-08-27: 06:00:00 4 mg via INTRAVENOUS
  Filled 2018-08-27: qty 1

## 2018-08-27 MED ORDER — PROPOFOL 10 MG/ML IV BOLUS
INTRAVENOUS | Status: DC | PRN
  Administered 2018-08-27: 160 mg via INTRAVENOUS

## 2018-08-27 MED ORDER — ACETAMINOPHEN 500 MG PO TABS
1000.0000 mg | ORAL_TABLET | Freq: Three times a day (TID) | ORAL | Status: DC
  Administered 2018-08-27: 17:00:00 1000 mg via ORAL
  Filled 2018-08-27: qty 2

## 2018-08-27 MED ORDER — SUGAMMADEX SODIUM 200 MG/2ML IV SOLN
INTRAVENOUS | Status: DC | PRN
  Administered 2018-08-27: 335 mg via INTRAVENOUS

## 2018-08-27 MED ORDER — LACTATED RINGERS IV SOLN
INTRAVENOUS | Status: DC
  Administered 2018-08-27: 09:00:00 via INTRAVENOUS

## 2018-08-27 MED ORDER — TRAMADOL 5 MG/ML ORAL SUSPENSION: 50.0000 mg | Freq: Four times a day (QID) | ORAL | Status: DC | PRN

## 2018-08-27 MED ORDER — OXYCODONE HCL 5 MG PO TABS: 5.0000 mg | ORAL_TABLET | ORAL | Status: DC | PRN

## 2018-08-27 MED ORDER — SODIUM CHLORIDE 0.9 % IV SOLN
2.0000 g | Freq: Once | INTRAVENOUS | Status: DC
  Administered 2018-08-27: 08:00:00 2 g via INTRAVENOUS
  Filled 2018-08-27: qty 20

## 2018-08-27 MED ORDER — LACTATED RINGERS IR SOLN
Status: DC | PRN
  Administered 2018-08-27: 1000 mL

## 2018-08-27 MED ORDER — OXYCODONE HCL 5 MG PO TABS: 5.0000 mg | ORAL_TABLET | Freq: Four times a day (QID) | ORAL | 0 refills | Status: DC | PRN

## 2018-08-27 MED ORDER — KETAMINE HCL 10 MG/ML IJ SOLN
INTRAMUSCULAR | Status: AC
  Filled 2018-08-27: qty 1

## 2018-08-27 MED ORDER — MORPHINE SULFATE (PF) 2 MG/ML IV SOLN: 2.0000 mg | INTRAVENOUS | Status: DC | PRN

## 2018-08-27 MED ORDER — ONDANSETRON 4 MG PO TBDP: 4.0000 mg | ORAL_TABLET | Freq: Four times a day (QID) | ORAL | Status: DC | PRN

## 2018-08-27 MED ORDER — ONDANSETRON HCL 4 MG/2ML IJ SOLN
INTRAMUSCULAR | Status: DC | PRN
  Administered 2018-08-27: 4 mg via INTRAVENOUS

## 2018-08-27 MED ORDER — SODIUM CHLORIDE 0.9 % IV SOLN
INTRAVENOUS | Status: DC | PRN
  Administered 2018-08-27: 08:00:00 500 mL via INTRAVENOUS

## 2018-08-27 MED ORDER — AMOXICILLIN-POT CLAVULANATE 875-125 MG PO TABS: 1.0000 | ORAL_TABLET | Freq: Two times a day (BID) | ORAL | 0 refills | Status: AC

## 2018-08-27 MED ORDER — DIPHENHYDRAMINE HCL 25 MG PO CAPS: 25.0000 mg | ORAL_CAPSULE | Freq: Four times a day (QID) | ORAL | Status: DC | PRN

## 2018-08-27 MED ORDER — SODIUM CHLORIDE 0.9 % IV SOLN: 2.0000 g | Freq: Once | INTRAVENOUS | Status: DC

## 2018-08-27 MED ORDER — PIPERACILLIN-TAZOBACTAM 3.375 G IVPB
3.3750 g | Freq: Three times a day (TID) | INTRAVENOUS | Status: DC
  Administered 2018-08-27: 15:00:00 3.375 g via INTRAVENOUS
  Filled 2018-08-27: qty 50

## 2018-08-27 MED ORDER — FENTANYL CITRATE (PF) 250 MCG/5ML IJ SOLN
INTRAMUSCULAR | Status: DC | PRN
  Administered 2018-08-27: 100 ug via INTRAVENOUS

## 2018-08-27 MED ORDER — IBUPROFEN 200 MG PO TABS: ORAL_TABLET | ORAL | Status: DC

## 2018-08-27 MED ORDER — SODIUM CHLORIDE (PF) 0.9 % IJ SOLN
INTRAMUSCULAR | Status: AC
  Filled 2018-08-27: qty 50

## 2018-08-27 MED ORDER — LIDOCAINE 2% (20 MG/ML) 5 ML SYRINGE
INTRAMUSCULAR | Status: AC
  Filled 2018-08-27: qty 5

## 2018-08-27 MED ORDER — 0.9 % SODIUM CHLORIDE (POUR BTL) OPTIME
TOPICAL | Status: DC | PRN
  Administered 2018-08-27: 10:00:00 1000 mL

## 2018-08-27 MED ORDER — LACTATED RINGERS IV SOLN: INTRAVENOUS | Status: DC

## 2018-08-27 MED ORDER — ROCURONIUM BROMIDE 10 MG/ML (PF) SYRINGE
PREFILLED_SYRINGE | INTRAVENOUS | Status: AC
  Filled 2018-08-27: qty 10

## 2018-08-27 MED ORDER — DEXAMETHASONE SODIUM PHOSPHATE 10 MG/ML IJ SOLN
INTRAMUSCULAR | Status: DC | PRN
  Administered 2018-08-27: 8 mg via INTRAVENOUS

## 2018-08-27 MED ORDER — ACETAMINOPHEN 500 MG PO TABS: ORAL_TABLET | ORAL | 0 refills | Status: AC

## 2018-08-27 MED ORDER — DEXTROSE-NACL 5-0.9 % IV SOLN
INTRAVENOUS | Status: DC
  Administered 2018-08-27: 15:00:00 via INTRAVENOUS

## 2018-08-27 MED ORDER — DIPHENHYDRAMINE HCL 50 MG/ML IJ SOLN: 25.0000 mg | Freq: Four times a day (QID) | INTRAMUSCULAR | Status: DC | PRN

## 2018-08-27 MED ORDER — IOHEXOL 300 MG/ML  SOLN
100.0000 mL | Freq: Once | INTRAMUSCULAR | Status: AC | PRN
  Administered 2018-08-27: 07:00:00 100 mL via INTRAVENOUS

## 2018-08-27 MED ORDER — LIDOCAINE 2% (20 MG/ML) 5 ML SYRINGE
INTRAMUSCULAR | Status: DC | PRN
  Administered 2018-08-27: 80 mg via INTRAVENOUS

## 2018-08-27 MED ORDER — METRONIDAZOLE IN NACL 5-0.79 MG/ML-% IV SOLN
500.0000 mg | Freq: Once | INTRAVENOUS | Status: AC
  Administered 2018-08-27: 500 mg via INTRAVENOUS
  Filled 2018-08-27: qty 100

## 2018-08-27 MED ORDER — AMOXICILLIN-POT CLAVULANATE 875-125 MG PO TABS: 1.0000 | ORAL_TABLET | Freq: Two times a day (BID) | ORAL | Status: DC

## 2018-08-27 MED ORDER — ROCURONIUM BROMIDE 10 MG/ML (PF) SYRINGE
PREFILLED_SYRINGE | INTRAVENOUS | Status: DC | PRN
  Administered 2018-08-27: 100 mg via INTRAVENOUS

## 2018-08-27 MED ORDER — IBUPROFEN 200 MG PO TABS: 600.0000 mg | ORAL_TABLET | Freq: Four times a day (QID) | ORAL | Status: DC | PRN

## 2018-08-27 MED ORDER — MEPERIDINE HCL 50 MG/ML IJ SOLN: 6.2500 mg | INTRAMUSCULAR | Status: DC | PRN

## 2018-08-27 MED ORDER — BUPIVACAINE HCL (PF) 0.5 % IJ SOLN
INTRAMUSCULAR | Status: AC
  Filled 2018-08-27: qty 30

## 2018-08-27 MED ORDER — KETAMINE HCL 10 MG/ML IJ SOLN
INTRAMUSCULAR | Status: DC | PRN
  Administered 2018-08-27: 25 mg via INTRAVENOUS

## 2018-08-27 MED ORDER — BUPIVACAINE HCL (PF) 0.5 % IJ SOLN
INTRAMUSCULAR | Status: DC | PRN
  Administered 2018-08-27: 20 mL

## 2018-08-27 MED ORDER — TRAMADOL HCL 50 MG PO TABS: 50.0000 mg | ORAL_TABLET | Freq: Four times a day (QID) | ORAL | Status: DC | PRN

## 2018-08-27 MED ORDER — ONDANSETRON HCL 4 MG/2ML IJ SOLN: 4.0000 mg | Freq: Four times a day (QID) | INTRAMUSCULAR | Status: DC | PRN

## 2018-08-27 MED ORDER — FENTANYL CITRATE (PF) 100 MCG/2ML IJ SOLN
25.0000 ug | INTRAMUSCULAR | Status: DC | PRN
  Administered 2018-08-27 (×2): 50 ug via INTRAVENOUS

## 2018-08-27 MED ORDER — PROPOFOL 10 MG/ML IV BOLUS
INTRAVENOUS | Status: AC
  Filled 2018-08-27: qty 20

## 2018-08-27 MED ORDER — MIDAZOLAM HCL 2 MG/2ML IJ SOLN
INTRAMUSCULAR | Status: DC | PRN
  Administered 2018-08-27: 2 mg via INTRAVENOUS

## 2018-08-27 MED ORDER — GLYCOPYRROLATE 0.2 MG/ML IJ SOLN
INTRAMUSCULAR | Status: DC | PRN
  Administered 2018-08-27: 0.2 mg via INTRAVENOUS

## 2018-08-27 MED FILL — oxyCODONE HCL 5 MG TABS: 5 | 5 days supply | Qty: 20 | Fill #0

## 2018-08-27 MED FILL — AMOX-CLAV 875-125 MG TABLET: 875-125 | 7 days supply | Qty: 14 | Fill #0

## 2018-08-27 SURGICAL SUPPLY — 38 items
APPLIER CLIP 5 13 M/L LIGAMAX5 (MISCELLANEOUS)
CHLORAPREP W/TINT 26 (MISCELLANEOUS) ×3 IMPLANT
CLIP APPLIE 5 13 M/L LIGAMAX5 (MISCELLANEOUS) IMPLANT
COVER SURGICAL LIGHT HANDLE (MISCELLANEOUS) ×3 IMPLANT
COVER WAND RF STERILE (DRAPES) IMPLANT
CUTTER FLEX LINEAR 45M (STAPLE) ×3 IMPLANT
DECANTER SPIKE VIAL GLASS SM (MISCELLANEOUS) ×3 IMPLANT
DERMABOND ADVANCED (GAUZE/BANDAGES/DRESSINGS) ×2
DERMABOND ADVANCED .7 DNX12 (GAUZE/BANDAGES/DRESSINGS) ×1 IMPLANT
DRAPE LAPAROSCOPIC ABDOMINAL (DRAPES) ×3 IMPLANT
ELECT COAG MONOPOLAR (ELECTROSURGICAL)
ELECT REM PT RETURN 15FT ADLT (MISCELLANEOUS) ×3 IMPLANT
ELECTRODE COAG MONOPOLAR (ELECTROSURGICAL) IMPLANT
GLOVE BIOGEL PI IND STRL 6.5 (GLOVE) ×1 IMPLANT
GLOVE BIOGEL PI IND STRL 7.0 (GLOVE) ×2 IMPLANT
GLOVE BIOGEL PI INDICATOR 6.5 (GLOVE) ×2
GLOVE BIOGEL PI INDICATOR 7.0 (GLOVE) ×4
GLOVE ECLIPSE 6.5 STRL STRAW (GLOVE) ×3 IMPLANT
GLOVE SURG SIGNA 7.5 PF LTX (GLOVE) ×3 IMPLANT
GLOVE SURG SS PI 6.5 STRL IVOR (GLOVE) ×3 IMPLANT
GOWN STRL REUS W/ TWL LRG LVL3 (GOWN DISPOSABLE) ×1 IMPLANT
GOWN STRL REUS W/TWL LRG LVL3 (GOWN DISPOSABLE) ×2
GOWN STRL REUS W/TWL XL LVL3 (GOWN DISPOSABLE) ×6 IMPLANT
KIT BASIN OR (CUSTOM PROCEDURE TRAY) ×3 IMPLANT
KIT TURNOVER KIT A (KITS) ×3 IMPLANT
POUCH SPECIMEN RETRIEVAL 10MM (ENDOMECHANICALS) ×3 IMPLANT
RELOAD 45 VASCULAR/THIN (ENDOMECHANICALS) IMPLANT
RELOAD STAPLE TA45 3.5 REG BLU (ENDOMECHANICALS) ×3 IMPLANT
SET IRRIG TUBING LAPAROSCOPIC (IRRIGATION / IRRIGATOR) ×3 IMPLANT
SET TUBE SMOKE EVAC HIGH FLOW (TUBING) ×3 IMPLANT
SHEARS HARMONIC ACE PLUS 36CM (ENDOMECHANICALS) ×3 IMPLANT
SLEEVE XCEL OPT CAN 5 100 (ENDOMECHANICALS) ×3 IMPLANT
SUT MNCRL AB 4-0 PS2 18 (SUTURE) ×3 IMPLANT
TOWEL OR 17X26 10 PK STRL BLUE (TOWEL DISPOSABLE) ×3 IMPLANT
TOWEL OR NON WOVEN STRL DISP B (DISPOSABLE) ×3 IMPLANT
TRAY LAPAROSCOPIC (CUSTOM PROCEDURE TRAY) ×3 IMPLANT
TROCAR BLADELESS OPT 5 100 (ENDOMECHANICALS) ×3 IMPLANT
TROCAR XCEL BLUNT TIP 100MML (ENDOMECHANICALS) ×3 IMPLANT

## 2018-08-27 NOTE — ED Triage Notes (Signed)
Pt presents with RLQ pain. Reports similar pain in the past that has come and gone, but it has been worse since Monday. Pt has "spots" on his pancreas and adrenal gland that are being watched. Wife called and states that he is "about due for another abdominal CT." Denies fever, endorses diarrhea.

## 2018-08-27 NOTE — Discharge Summary (Signed)
Physician Discharge Summary  Patient ID: Timothy Hoover MRN: [id number] DOB/AGE: Jun 30, 1955 63 y.o.  Admit date: 08/27/2018 Discharge date: 08/27/2018  Admission Diagnoses:  Acute appendicitis with possible perforation 1.3 cm pancreatic cyst 2.2 cm right adrenal adenoma Hypertension Hyperlipidemia  Discharge Diagnoses:  Acute appendicitis 1.3 cm pancreatic cyst 2.2 cm right adrenal adenoma Hypertension Hyperlipidemia  Active Problems:   Acute appendicitis   PROCEDURES: Laparoscopic appendectomy  Hospital Course:  Patient presented to the ED this morning with abdominal pain in the right lower quadrant.  He reports the pain started Monday 3 days ago.  He is off a bit he was able to run around the neighborhood but since 2 AM the pains been intense and he was unable to sleep.  He has had similar pains like this in the past.  The last time was about 3 months ago.  The pain lasted a couple hours but he had ongoing soreness for some time.  No nausea, no vomiting, no diarrhea.  Last bowel movement was 3 days ago.  There are no urinary symptoms.  He took Motrin 2 AM with minimal to no relief.  He has a history of a pancreatic abnormality and is about due for a repeat CT scan.  Work-up in the ED: He is afebrile but blood pressure was elevated on admission 179/98.  He was not tachycardic.  Sats were good on room air.  CMP was normal, except for glucose of 130 and a total bilirubin of 1.8.  WBC is 11.8, hemoglobin 14.3, hematocrit 41.3, lites 160,000.  CT scan shows acute appendicitis with mesoappendix inflammation indistinct possible ruptured appendix no abscess noted on the CT report.  Pancreas shows a cystic lesion in the tail reported on MRI from 08/2017.  At that time there was no pancreatic inflammation no main duct dilatation, cystic lesion in the tail the pancreas measuring 1.3 cm unchanged no previous exam no solid pancreatic mass identified.  His pain is currently all in his right lower  quadrant.  Patient was seen in the emergency department and admitted.  He was taken the operating room and underwent laparoscopic appendectomy.  Is found to have acute appendicitis the tip of the appendix was retrocecal there was moderate inflammation, some vigorous exudate but no perforation.  Started on IV antibiotics in the ED. postop he is doing really well and wants to go home this evening after surgery.  We are going to advance his diet, mobilize, and if he is doing well go home later this evening after supper.  We have placed him on 7 days of Augmentin postop.  He has follow-up with the office/telephone conference.  CBC Latest Ref Rng & Units 08/27/2018  WBC 4.0 - 10.5 K/uL 11.8(H)  Hemoglobin 13.0 - 17.0 g/dL 14.3  Hematocrit 39.0 - 52.0 % 41.3  Platelets 150 - 400 K/uL 160   CMP Latest Ref Rng & Units 08/27/2018  Glucose 70 - 99 mg/dL 130(H)  BUN 6 - 20 mg/dL 18  Creatinine 0.61 - 1.24 mg/dL 1.01  Sodium 135 - 145 mmol/L 137  Potassium 3.5 - 5.1 mmol/L 3.8  Chloride 98 - 111 mmol/L 107  CO2 22 - 32 mmol/L 22  Calcium 8.9 - 10.3 mg/dL 9.0  Total Protein 6.5 - 8.1 g/dL 7.3  Total Bilirubin 0.3 - 1.2 mg/dL 1.8(H)  Alkaline Phos 38 - 126 U/L 76  AST 15 - 41 U/L 17  ALT 0 - 44 U/L 19    CT of the abdomen pelvis  with contrast/7/20 Lower chest:  No contributory findings. Hepatobiliary: No focal liver abnormality.No evidence of biliary obstruction or stone. Pancreas: Cystic lesion in the pancreatic tail which was most recently characterized by MRI in 2019. Spleen: Unremarkable. Adrenals/Urinary Tract: 2.2 cm right adrenal adenoma confirmed on prior MRI of the abdomen 08/24/2017. No hydronephrosis or stone. Unremarkable bladder. Stomach/Bowel: Hyperenhancing distal appendix which is positioned posterior to the ascending colon, with extensive mesial appendiceal fat edema. The tip of the appendix is indistinct. No abscess. No appendicolith. No ileus or obstruction. There are a  few colonic diverticula. Vascular/Lymphatic: Scattered atherosclerotic calcifications. No mass or adenopathy. Reproductive:No pathologic findings. Other: No ascites or pneumoperitoneum. Musculoskeletal: No acute abnormalities. These results were called by telephone at the time of interpretation on 08/27/2018 at 7:34 am to Dr. Cathi Roan, who verbally acknowledged these results. IMPRESSION: Acute appendicitis. Mesoappendix inflammation is extensive and the tip is indistinct-possible rupture. No abscess.     Disposition: Discharge disposition: 01-Home or Self Care        Allergies as of 08/27/2018   No Known Allergies     Medication List    TAKE these medications   acetaminophen 500 MG tablet Commonly known as:  TYLENOL You can take 1000 mg every 8 hours for pain.  I would just take this on a regular basis for the first couple days, and then you can make it every 8 hours as needed after that. This is your first medication for pain control.  Do not take over 4000 mg of Tylenol per day it can harm your liver.  You can buy this over-the-counter at any drugstore.   amoxicillin-clavulanate 875-125 MG tablet Commonly known as:  AUGMENTIN Take 1 tablet by mouth every 12 (twelve) hours for 7 days.   ibuprofen 200 MG tablet Commonly known as:  ADVIL You can take 2 to 3 tablets every 6 hours as needed for pain.  This is your second medication for pain control.  You can start this about 2 hours after you start the Tylenol.   Alternate the Tylenol and ibuprofen.  You can buy the ibuprofen over-the-counter at any drugstore.  Do not exceed this limit of 2 to 3 tablets of ibuprofen every 6 hours.   oxyCODONE 5 MG immediate release tablet Commonly known as:  Oxy IR/ROXICODONE Take 1 tablet (5 mg total) by mouth every 6 (six) hours as needed for severe pain (pain not relieved by Tylenol & Ibuprofen).   ramipril 5 MG capsule Commonly known as:  ALTACE Take 5 mg by mouth daily.    sildenafil 20 MG tablet Commonly known as:  REVATIO Take 20 mg by mouth daily.   simvastatin 20 MG tablet Commonly known as:  ZOCOR Take 20 mg by mouth daily.      Follow-up Information    [facility] Follow up on 09/10/2018.   Specialty:  General Surgery Why:  Your follow up will be a phone call appointment  (due to the [facility] virus, to limit exposure.) Carlena Hurl will call you at 2 PM.  Please email a picture if you have an concerns with you wound to : [email or web address] @ [email or web address] Contact information: [address] [place] [address] [phone]        Wenda Low, MD Follow up.   Specialty:  Internal Medicine Why:  call and let them know you had another CT and your appendix removed.   Contact information: [address], [address] [place] [address] [phone]  SignedEarnstine Regal 08/27/2018, 4:27 PM

## 2018-08-27 NOTE — ED Provider Notes (Signed)
I informed patient that his CT scan shows that he has acute appendicitis.  I got a call from the radiologist stating that there was an appendicitis with early rupture.  Patient is given IV antibiotics and I will await a callback from surgery.  Patient has been stable otherwise during his time here in the emergency department.   Dalia Heading, PA-C 08/27/18 0802    Valarie Merino, MD 08/27/18 1308

## 2018-08-27 NOTE — Discharge Instructions (Signed)
[facility] ______[facility] °LAPAROSCOPIC SURGERY: POST OP INSTRUCTIONS °Always review your discharge instruction sheet given to you by the facility where your surgery was performed. °IF YOU HAVE DISABILITY OR FAMILY LEAVE FORMS, YOU MUST BRING THEM TO THE OFFICE FOR PROCESSING.   °DO NOT GIVE THEM TO YOUR DOCTOR. ° °1. A prescription for pain medication may be given to you upon discharge.  Take your pain medication as prescribed, if needed.  If narcotic pain medicine is not needed, then you may take acetaminophen (Tylenol) or ibuprofen (Advil) as needed. °2. Take your usually prescribed medications unless otherwise directed. °3. If you need a refill on your pain medication, please contact your pharmacy.  They will contact our office to request authorization. Prescriptions will not be filled after 5pm or on week-ends. °4. You should follow a light diet the first few days after arrival home, such as soup and crackers, etc.  Be sure to include lots of fluids daily. °5. Most patients will experience some swelling and bruising in the area of the incisions.  Ice packs will help.  Swelling and bruising can take several days to resolve.  °6. It is common to experience some constipation if taking pain medication after surgery.  Increasing fluid intake and taking a stool softener (such as Colace) will usually help or prevent this problem from occurring.  A mild laxative (Milk of Magnesia or Miralax) should be taken according to package instructions if there are no bowel movements after 48 hours. °7. Unless discharge instructions indicate otherwise, you may remove your bandages 24-48 hours after surgery, and you may shower at that time.  You may have steri-strips (small skin tapes) in place directly over the incision.  These strips should be left on the skin for 7-10 days.  If your surgeon used skin glue on the incision, you may shower in 24 hours.  The glue will flake off over the next 2-3 weeks.  Any sutures or  staples will be removed at the office during your follow-up visit. °8. ACTIVITIES:  You may resume regular (light) daily activities beginning the next day--such as daily self-care, walking, climbing stairs--gradually increasing activities as tolerated.  You may have sexual intercourse when it is comfortable.  Refrain from any heavy lifting or straining until approved by your doctor. °a. You may drive when you are no longer taking prescription pain medication, you can comfortably wear a seatbelt, and you can safely maneuver your car and apply brakes. °b. RETURN TO WORK:  __________________________________________________________ °9. You should see your doctor in the office for a follow-up appointment approximately 2-3 weeks after your surgery.  Make sure that you call for this appointment within a day or two after you arrive home to insure a convenient appointment time. °10. OTHER INSTRUCTIONS: __________________________________________________________________________________________________________________________ __________________________________________________________________________________________________________________________ °WHEN TO CALL YOUR DOCTOR: °1. Fever over 101.0 °2. Inability to urinate °3. Continued bleeding from incision. °4. Increased pain, redness, or drainage from the incision. °5. Increasing abdominal pain ° °The clinic staff is available to answer your questions during regular business hours.  Please don’t hesitate to call and ask to speak to one of the nurses for clinical concerns.  If you have a medical emergency, go to the nearest emergency room or call 911.  A surgeon from [facility] is always on call at the hospital. °[address], [address], [place], [place]  [address] ? [address] [address], [place],    [address] °[phone] ? [phone] ? FAX [phone] °Web site:   [email or web address]   Laparoscopic Appendectomy, Adult, Care After This sheet  gives you information about how to care for yourself after your procedure. Your health care provider may also give you more specific instructions. If you have problems or questions, contact your health care provider. What can I expect after the procedure? After the procedure, it is common to have:  Little energy for normal activities.  Mild pain in the area where the incisions were made.  Difficulty passing stool (constipation). This can be caused by: ? Pain medicine. ? A decrease in your activity. Follow these instructions at home: Medicines  Take over-the-counter and prescription medicines only as told by your health care provider.  If you were prescribed an antibiotic medicine, take it as told by your health care provider. Do not stop taking the antibiotic even if you start to feel better.  Do not drive or use heavy machinery while taking prescription pain medicine.  Ask your health care provider if the medicine prescribed to you can cause constipation. You may need to take steps to prevent or treat constipation, such as: ? Drink enough fluid to keep your urine pale yellow. ? Take over-the-counter or prescription medicines. ? Eat foods that are high in fiber, such as beans, whole grains, and fresh fruits and vegetables. ? Limit foods that are high in fat and processed sugars, such as fried or sweet foods. Incision care   Follow instructions from your health care provider about how to take care of your incisions. Make sure you: ? Wash your hands with soap and water before and after you change your bandage (dressing). If soap and water are not available, use hand sanitizer. ? Change your dressing as told by your health care provider. ? Leave stitches (sutures), skin glue, or adhesive strips in place. These skin closures may need to stay in place for 2 weeks or longer. If adhesive strip edges start to loosen and curl up, you may trim the loose edges. Do not remove adhesive strips  completely unless your health care provider tells you to do that.  Check your incision areas every day for signs of infection. Check for: ? Redness, swelling, or pain. ? Fluid or blood. ? Warmth. ? Pus or a bad smell. Bathing  Keep your incisions clean and dry. Clean them as often as told by your health care provider. To do this: 1. Gently wash the incisions with soap and water. 2. Rinse the incisions with water to remove all soap. 3. Pat the incisions dry with a clean towel. Do not rub the incisions.  Do not take baths, swim, or use a hot tub for 2 weeks, or until your health care provider approves. You may take showers after 48 hours. Activity   Do not drive for 24 hours if you were given a sedative during your procedure.  Rest after the procedure. Return to your normal activities as told by your health care provider. Ask your health care provider what activities are safe for you.  For 3 weeks, or for as long as told by your health care provider: ? Do not lift anything that is heavier than 10 lb (4.5 kg), or the limit that you are told. ? Do not play contact sports. General instructions  If you were sent home with a drain, follow instructions from your health care provider about how to care for it.  Take deep breaths. This helps to prevent your lungs from developing an infection (pneumonia).  Keep all follow-up visits as  told by your health care provider. This is important. Contact a health care provider if:  You have redness, swelling, or pain around an incision.  You have fluid or blood coming from an incision.  Your incision feels warm to the touch.  You have pus or a bad smell coming from an incision or dressing.  Your incision edges break open after your sutures have been removed.  You have increasing pain in your shoulders.  You feel dizzy or you faint.  You develop shortness of breath.  You keep feeling nauseous or you are vomiting.  You have diarrhea or  you cannot control your bowel functions.  You lose your appetite.  You develop swelling or pain in your legs.  You develop a rash. Get help right away if you have:  A fever.  Difficulty breathing.  Sharp pains in your chest. Summary  After a laparoscopic appendectomy, it is common to have little energy for normal activities, mild pain in the area of the incisions, and constipation.  Infection is the most common complication after this procedure. Follow your health care provider's instructions about caring for yourself after the procedure.  Rest after the procedure. Return to your normal activities as told by your health care provider.  Contact your health care provider if you notice signs of infection around your incisions or you develop shortness of breath. Get help right away if you have a fever, chest pain, or difficulty breathing. This information is not intended to replace advice given to you by your health care provider. Make sure you discuss any questions you have with your health care provider. Document Released: 04/08/2005 Document Revised: 10/09/2017 Document Reviewed: 10/09/2017 Elsevier Interactive Patient Education  2019 [employer].

## 2018-08-27 NOTE — Anesthesia Postprocedure Evaluation (Signed)
Anesthesia Post Note  Patient: Timothy Hoover  Procedure(s) Performed: APPENDECTOMY LAPAROSCOPIC (N/A Abdomen)     Patient location during evaluation: PACU Anesthesia Type: General Level of consciousness: awake and alert Pain management: pain level controlled Vital Signs Assessment: post-procedure vital signs reviewed and stable Respiratory status: spontaneous breathing, nonlabored ventilation, respiratory function stable and patient connected to nasal cannula oxygen Cardiovascular status: blood pressure returned to baseline and stable Postop Assessment: no apparent nausea or vomiting Anesthetic complications: no    Last Vitals:  Vitals:   08/27/18 1230 08/27/18 1330  BP: 140/88 (!) 144/84  Pulse: 67 61  Resp: 17 17  Temp: 36.9 C 37 C  SpO2: 99% 97%    Last Pain:  Vitals:   08/27/18 1330  TempSrc: Oral  PainSc:                  [name]

## 2018-08-27 NOTE — ED Provider Notes (Signed)
[facility] DEPT Provider Note   CSN: [id number] Arrival date & time: 08/27/18  4268    History   Chief Complaint Chief Complaint  Patient presents with  . Abdominal Pain    HPI DANIYAL TABOR is a 63 y.o. male.     The history is provided by the patient and medical records.  Abdominal Pain     63 y.o. M with hx of HTN, presenting to the ED for RLQ pain.  Patient reports this began on Monday, 3 days ago.  States yesterday pain eased off a bit and he was able to run around the neighborhood, but since around 2am pain has been intense and preventing him from sleeping.  Pain localized to right lower abdomen, no radiation.  No nausea, vomiting, diarrhea.  Normal BM 3 days ago.  No fever, chills, sweats.  No urinary symptoms.  Prior surgeries include hernia repair.  Took motrin around 2am, mild to no relief.  Past Medical History:  Diagnosis Date  . Hypertension     There are no active problems to display for this patient.   Past Surgical History:  Procedure Laterality Date  . COLONOSCOPY WITH PROPOFOL N/A 10/31/2015   Procedure: COLONOSCOPY WITH PROPOFOL;  Surgeon: Garlan Fair, MD;  Location: [facility] ENDOSCOPY;  Service: Endoscopy;  Laterality: N/A;  . ESOPHAGOGASTRODUODENOSCOPY (EGD) WITH PROPOFOL N/A 03/29/2018   Procedure: endoscopy;  Surgeon: Laurence Spates, MD;  Location: [facility] ENDOSCOPY;  Service: Endoscopy;  Laterality: N/A;  . FOREIGN BODY REMOVAL  03/29/2018   Procedure: FOREIGN BODY REMOVAL;  Surgeon: Laurence Spates, MD;  Location: [facility]OSCOPY;  Service: Endoscopy;;  . HERNIA REPAIR    . RHINOPLASTY          Home Medications    Prior to Admission medications   Medication Sig Start Date End Date Taking? Authorizing Provider  ramipril (ALTACE) 5 MG capsule Take 5 mg by mouth daily.    [provider]  sildenafil (REVATIO) 20 MG tablet Take 20 mg by mouth daily. 02/09/18   [provider]  simvastatin (ZOCOR) 20 MG  tablet Take 20 mg by mouth daily. 12/31/17   [provider]    Family History No family history on file.  Social History Social History   Tobacco Use  . Smoking status: Never Smoker  . Smokeless tobacco: Never Used  Substance Use Topics  . Alcohol use: Yes    Comment: occasional  . Drug use: No     Allergies   Patient has no known allergies.   Review of Systems Review of Systems  Gastrointestinal: Positive for abdominal pain.  All other systems reviewed and are negative.    Physical Exam Updated Vital Signs BP (!) 179/98 (BP Location: Left Arm)   Pulse 70   Temp 98.5 F (36.9 C) (Oral)   Resp 20   SpO2 98%   Physical Exam Vitals signs and nursing note reviewed.  Constitutional:      Appearance: He is well-developed.  HENT:     Head: Normocephalic and atraumatic.  Eyes:     Conjunctiva/sclera: Conjunctivae normal.     Pupils: Pupils are equal, round, and reactive to light.  Neck:     Musculoskeletal: Normal range of motion.  Cardiovascular:     Rate and Rhythm: Normal rate and regular rhythm.     Heart sounds: Normal heart sounds.  Pulmonary:     Effort: Pulmonary effort is normal.     Breath sounds: Normal breath sounds.  Abdominal:     General: Bowel sounds are normal.     Palpations: Abdomen is soft.     Tenderness: There is abdominal tenderness in the right lower quadrant.  Musculoskeletal: Normal range of motion.  Skin:    General: Skin is warm and dry.  Neurological:     Mental Status: He is alert and oriented to person, place, and time.      ED Treatments / Results  Labs (all labs ordered are listed, but only abnormal results are displayed) Labs Reviewed  CBC WITH DIFFERENTIAL/PLATELET - Abnormal; Notable for the following components:      Result Value   WBC 11.8 (*)    Neutro Abs 8.8 (*)    Monocytes Absolute 1.2 (*)    All other components within normal limits  COMPREHENSIVE METABOLIC PANEL - Abnormal; Notable for the  following components:   Glucose, Bld 130 (*)    Total Bilirubin 1.8 (*)    All other components within normal limits  LIPASE, BLOOD  URINALYSIS, ROUTINE W REFLEX MICROSCOPIC    EKG None  Radiology No results found.  Procedures Procedures (including critical care time)  Medications Ordered in ED Medications  sodium chloride (PF) 0.9 % injection (has no administration in time range)  morphine 4 MG/ML injection 4 mg (4 mg Intravenous Given 08/27/18 0558)  iohexol (OMNIPAQUE) 300 MG/ML solution 100 mL (100 mLs Intravenous Contrast Given 08/27/18 0160)     Initial Impression / Assessment and Plan / ED Course  I have reviewed the triage vital signs and the nursing notes.  Pertinent labs & imaging results that were available during my care of the patient were reviewed by me and considered in my medical decision making (see chart for details).  63 year old male here with lower abdominal pain that began 3 days ago.  Pain right lower nature without radiation, better yesterday but worsened again throughout the night.  He denies any nausea, vomiting or diarrhea.  No urinary symptoms.  He is afebrile and nontoxic.  Tenderness in the right lower quadrant, remainder of abdomen is soft and benign.  Plan for screening labs, CT scan.  Morphine given for pain.  Labs overall reassuring.  CT pending.  Care will be signed out to morning team to follow-up on CT results.  Final Clinical Impressions(s) / ED Diagnoses   Final diagnoses:  Right lower quadrant abdominal pain    ED Discharge Orders    None       Larene Pickett, PA-C 08/27/18 0656    Shanon Rosser, MD 08/27/18 2238

## 2018-08-27 NOTE — Progress Notes (Signed)
Pharmacy Antibiotic Note  Timothy Hoover is a 63 y.o. male admitted on 08/27/2018 with appendicitis with early rupture. Pharmacy has been consulted for zosyn dosing.  Plan: Zosyn 3.375g IV q8h (4 hour infusion).  Pharmacy to sign off  Height: 6' (182.9 cm) Weight: 185 lb (83.9 kg) IBW/kg (Calculated) : 77.6  Temp (24hrs), Avg:98.5 F (36.9 C), Min:98.5 F (36.9 C), Max:98.5 F (36.9 C)  Recent Labs  Lab 08/27/18 0553  WBC 11.8*  CREATININE 1.01    Estimated Creatinine Clearance: 85.4 mL/min (by C-G formula based on SCr of 1.01 mg/dL).    No Known Allergies Thank you for allowing pharmacy to be a part of this patient's care. Eudelia Bunch, Pharm.D 08/27/2018 8:53 AM

## 2018-08-27 NOTE — ED Notes (Signed)
Bed: IP77 Expected date:  Expected time:  Means of arrival:  Comments: POV [place]

## 2018-08-27 NOTE — ED Notes (Signed)
Pt transported to CT ?

## 2018-08-27 NOTE — Anesthesia Procedure Notes (Signed)
Procedure Name: Intubation Date/Time: 08/27/2018 9:51 AM Performed by: Eben Burow, CRNA Pre-anesthesia Checklist: Patient identified, Emergency Drugs available, Suction available, Patient being monitored and Timeout performed Patient Re-evaluated:Patient Re-evaluated prior to induction Oxygen Delivery Method: Circle system utilized Preoxygenation: Pre-oxygenation with 100% oxygen Induction Type: IV induction and Rapid sequence Laryngoscope Size: Mac and 4 Grade View: Grade II Tube type: Oral Tube size: 7.5 mm Number of attempts: 1 Airway Equipment and Method: Stylet Placement Confirmation: ETT inserted through vocal cords under direct vision,  positive ETCO2 and breath sounds checked- equal and bilateral Secured at: 21 cm Tube secured with: Tape Dental Injury: Teeth and Oropharynx as per pre-operative assessment

## 2018-08-27 NOTE — Anesthesia Preprocedure Evaluation (Signed)
Anesthesia Evaluation  Patient identified by MRN, date of birth, ID band Patient awake    Reviewed: Allergy & Precautions, NPO status , Patient's Chart, lab work & pertinent test results  Airway Mallampati: II  TM Distance: >3 FB Neck ROM: Full    Dental no notable dental hx.    Pulmonary neg pulmonary ROS,    Pulmonary exam normal breath sounds clear to auscultation       Cardiovascular hypertension, Pt. on medications Normal cardiovascular exam Rhythm:Regular Rate:Normal     Neuro/Psych negative neurological ROS  negative psych ROS   GI/Hepatic negative GI ROS, Neg liver ROS,   Endo/Other  negative endocrine ROS  Renal/GU negative Renal ROS  negative genitourinary   Musculoskeletal negative musculoskeletal ROS (+)   Abdominal   Peds negative pediatric ROS (+)  Hematology negative hematology ROS (+)   Anesthesia Other Findings   Reproductive/Obstetrics negative OB ROS                             Anesthesia Physical Anesthesia Plan  ASA: II and emergent  Anesthesia Plan: General   Post-op Pain Management:    Induction: Intravenous  PONV Risk Score and Plan: 2 and Ondansetron and Treatment may vary due to age or medical condition  Airway Management Planned: Oral ETT  Additional Equipment:   Intra-op Plan:   Post-operative Plan: Extubation in OR  Informed Consent: I have reviewed the patients History and Physical, chart, labs and discussed the procedure including the risks, benefits and alternatives for the proposed anesthesia with the patient or authorized representative who has indicated his/her understanding and acceptance.     Dental advisory given  Plan Discussed with: CRNA  Anesthesia Plan Comments:         Anesthesia Quick Evaluation

## 2018-08-27 NOTE — Progress Notes (Signed)
Pt wants to go home later this PM, will up his diet and get him ready for discharge later today.

## 2018-08-27 NOTE — H&P (Signed)
[facility] Surgery Admission Note  NIKOLAY DEMETRIOU 1955/11/29  [id number].     Chief Complaint:  RLQ pain, worse since Monday Reason for Consult: Acute appendicitis with possible perforation  HPI: Patient presented to the ED this morning with abdominal pain in the right lower quadrant.  He reports the pain started Monday 3 days ago.  He is off a bit he was able to run around the neighborhood but since 2 AM the pains been intense and he was unable to sleep.  He has had similar pains like this in the past.  The last time was about 3 months ago.  The pain lasted a couple hours but he had ongoing soreness for some time.  No nausea, no vomiting, no diarrhea.  Last bowel movement was 3 days ago.  There are no urinary symptoms.  He took Motrin 2 AM with minimal to no relief.  He has a history of a pancreatic abnormality and is about due for a repeat CT scan.  Work-up in the ED: He is afebrile but blood pressure was elevated on admission 179/98.  He was not tachycardic.  Sats were good on room air.  CMP was normal, except for glucose of 130 and a total bilirubin of 1.8.  WBC is 11.8, hemoglobin 14.3, hematocrit 41.3, lites 160,000.  CT scan shows acute appendicitis with mesoappendix inflammation indistinct possible ruptured appendix no abscess noted on the CT report.  Pancreas shows a cystic lesion in the tail reported on MRI from 08/2017.  At that time there was no pancreatic inflammation no main duct dilatation, cystic lesion in the tail the pancreas measuring 1.3 cm unchanged no previous exam no solid pancreatic mass identified.  His pain is currently all in his right lower quadrant.    ROS: Review of Systems  Constitutional: Negative.   HENT: Negative.   Eyes: Negative.   Respiratory: Negative.   Cardiovascular: Negative.   Gastrointestinal: Positive for abdominal pain (Right lower quadrant). Negative for blood in stool, constipation, diarrhea, heartburn, melena, nausea and vomiting.        He notes he has not missed a meal since this started.  Genitourinary: Negative.        He is undergone a couple prostate biopsies by Dr. Diona Fanti.  Musculoskeletal: Negative.        He did some running yesterday.  Skin: Negative.   Neurological: Negative.   Endo/Heme/Allergies: Negative.   Psychiatric/Behavioral: Negative.     History reviewed. No pertinent family history.  Past Medical History:  Diagnosis Date  . Hypertension     Past Surgical History:  Procedure Laterality Date  . COLONOSCOPY WITH PROPOFOL N/A 10/31/2015   Procedure: COLONOSCOPY WITH PROPOFOL;  Surgeon: Garlan Fair, MD;  Location: [facility];  Service: Endoscopy;  Laterality: N/A;  . ESOPHAGOGASTRODUODENOSCOPY (EGD) WITH PROPOFOL N/A 03/29/2018   Procedure: endoscopy;  Surgeon: Laurence Spates, MD;  Location: [facility];  Service: Endoscopy;  Laterality: N/A;  . FOREIGN BODY REMOVAL  03/29/2018   Procedure: FOREIGN BODY REMOVAL;  Surgeon: Laurence Spates, MD;  Location: [facility];  Service: Endoscopy;;  . HERNIA REPAIR    . RHINOPLASTY      Social History:  reports that he has never smoked. He has never used smokeless tobacco. He reports current alcohol use. He reports that he does not use drugs. Tobacco: None  Drugs: None  EtOH: On average 8-10 beers per week. He is married his wife is a Marine scientist at the oncology center.  He is  a Theatre manager.   allergies: No Known Allergies  Prior to Admission medications   Medication Sig Start Date End Date Taking? Authorizing Provider  ramipril (ALTACE) 5 MG capsule Take 5 mg by mouth daily.    [provider]  sildenafil (REVATIO) 20 MG tablet Take 20 mg by mouth daily. 02/09/18   [provider]  simvastatin (ZOCOR) 20 MG tablet Take 20 mg by mouth daily. 12/31/17   [provider]     Blood pressure (!) 143/79, pulse (!) 56, temperature 98.5 F (36.9 C), temperature source Oral, resp. rate 20, height 6' (1.829 m), weight 83.9  kg, SpO2 100 %. Physical Exam: Physical Exam Constitutional:      General: He is not in acute distress.    Appearance: He is well-developed and normal weight. He is not ill-appearing, toxic-appearing or diaphoretic.  HENT:     Head: Normocephalic and atraumatic.     Mouth/Throat:     Mouth: Mucous membranes are moist.     Pharynx: Oropharynx is clear.  Eyes:     Comments: Pupils are equal  Cardiovascular:     Rate and Rhythm: Normal rate and regular rhythm.     Heart sounds: Normal heart sounds. No murmur.  Pulmonary:     Effort: Pulmonary effort is normal. No respiratory distress.     Breath sounds: Normal breath sounds. No stridor. No wheezing, rhonchi or rales.  Chest:     Chest wall: No tenderness.  Abdominal:     General: Abdomen is flat. Bowel sounds are absent. There is no distension or abdominal bruit. There are no signs of injury.     Palpations: Abdomen is soft.     Tenderness: There is abdominal tenderness in the right lower quadrant.     Hernia: No hernia is present.     Comments: He is tender in the right lower quadrant.  Had a prior right inguinal hernia repair in his 89s.  Skin:    General: Skin is warm and dry.  Neurological:     General: No focal deficit present.     Mental Status: He is alert and oriented to person, place, and time.     Cranial Nerves: No cranial nerve deficit.  Psychiatric:        Mood and Affect: Mood normal.        Behavior: Behavior normal.     Results for orders placed or performed during the hospital encounter of 08/27/18 (from the past 48 hour(s))  CBC with Differential     Status: Abnormal   Collection Time: 08/27/18  5:53 AM  Result Value Ref Range   WBC 11.8 (H) 4.0 - 10.5 K/uL   RBC 4.49 4.22 - 5.81 MIL/uL   Hemoglobin 14.3 13.0 - 17.0 g/dL   HCT 41.3 39.0 - 52.0 %   MCV 92.0 80.0 - 100.0 fL   MCH 31.8 26.0 - 34.0 pg   MCHC 34.6 30.0 - 36.0 g/dL   RDW 11.8 11.5 - 15.5 %   Platelets 160 150 - 400 K/uL   nRBC 0.0 0.0 -  0.2 %   Neutrophils Relative % 74 %   Neutro Abs 8.8 (H) 1.7 - 7.7 K/uL   Lymphocytes Relative 14 %   Lymphs Abs 1.6 0.7 - 4.0 K/uL   Monocytes Relative 10 %   Monocytes Absolute 1.2 (H) 0.1 - 1.0 K/uL   Eosinophils Relative 1 %   Eosinophils Absolute 0.1 0.0 - 0.5 K/uL   Basophils Relative  0 %   Basophils Absolute 0.0 0.0 - 0.1 K/uL   Immature Granulocytes 1 %   Abs Immature Granulocytes 0.06 0.00 - 0.07 K/uL    Comment: Performed at [facility], [place] [address] [place], [place] [address]  Comprehensive metabolic panel     Status: Abnormal   Collection Time: 08/27/18  5:53 AM  Result Value Ref Range   Sodium 137 135 - 145 mmol/L   Potassium 3.8 3.5 - 5.1 mmol/L   Chloride 107 98 - 111 mmol/L   CO2 22 22 - 32 mmol/L   Glucose, Bld 130 (H) 70 - 99 mg/dL   BUN 18 6 - 20 mg/dL   Creatinine, Ser 1.01 0.61 - 1.24 mg/dL   Calcium 9.0 8.9 - 10.3 mg/dL   Total Protein 7.3 6.5 - 8.1 g/dL   Albumin 4.7 3.5 - 5.0 g/dL   AST 17 15 - 41 U/L   ALT 19 0 - 44 U/L   Alkaline Phosphatase 76 38 - 126 U/L   Total Bilirubin 1.8 (H) 0.3 - 1.2 mg/dL   GFR calc non Af Amer >60 >60 mL/min   GFR calc Af Amer >60 >60 mL/min   Anion gap 8 5 - 15    Comment: Performed at [facility], [place] [address], [place], [place] [address]  Lipase, blood     Status: None   Collection Time: 08/27/18  5:53 AM  Result Value Ref Range   Lipase 30 11 - 51 U/L    Comment: Performed at [facility], [place] [address], [place], [place] [address]  Urinalysis, Routine w reflex microscopic     Status: None   Collection Time: 08/27/18  6:05 AM  Result Value Ref Range   Color, Urine YELLOW YELLOW   APPearance CLEAR CLEAR   Specific Gravity, Urine 1.024 1.005 - 1.030   pH 5.0 5.0 - 8.0   Glucose, UA NEGATIVE NEGATIVE mg/dL   Hgb urine dipstick NEGATIVE NEGATIVE   Bilirubin Urine NEGATIVE NEGATIVE   Ketones, ur NEGATIVE NEGATIVE mg/dL   Protein, ur NEGATIVE NEGATIVE  mg/dL   Nitrite NEGATIVE NEGATIVE   Leukocytes,Ua NEGATIVE NEGATIVE    Comment: Performed at [place] [address], [place], [place] [address]   Ct Abdomen Pelvis W Contrast  Result Date: 08/27/2018 CLINICAL DATA:  Abdominal pain with appendicitis suspected EXAM: CT ABDOMEN AND PELVIS WITH CONTRAST TECHNIQUE: Multidetector CT imaging of the abdomen and pelvis was performed using the standard protocol following bolus administration of intravenous contrast. CONTRAST:  187mL OMNIPAQUE IOHEXOL 300 MG/ML  SOLN COMPARISON:  None. FINDINGS: Lower chest:  No contributory findings. Hepatobiliary: No focal liver abnormality.No evidence of biliary obstruction or stone. Pancreas: Cystic lesion in the pancreatic tail which was most recently characterized by MRI in 2019. Spleen: Unremarkable. Adrenals/Urinary Tract: 2.2 cm right adrenal adenoma confirmed on prior MRI of the abdomen 08/24/2017. No hydronephrosis or stone. Unremarkable bladder. Stomach/Bowel: Hyperenhancing distal appendix which is positioned posterior to the ascending colon, with extensive mesial appendiceal fat edema. The tip of the appendix is indistinct. No abscess. No appendicolith. No ileus or obstruction. There are a few colonic diverticula. Vascular/Lymphatic: Scattered atherosclerotic calcifications. No mass or adenopathy. Reproductive:No pathologic findings. Other: No ascites or pneumoperitoneum. Musculoskeletal: No acute abnormalities. These results were called by telephone at the time of interpretation on 08/27/2018 at 7:34 am to Dr. Cathi Roan, who verbally acknowledged these results. IMPRESSION: Acute appendicitis. Mesoappendix inflammation is extensive and the tip  is indistinct-possible rupture. No abscess. Electronically Signed   By: Monte Fantasia M.D.   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  He has been started on IV antibiotics, IV rehydration.  We plan to take him the operating room for laparoscopic appendectomy this AM.  Earnstine Regal [facility] Surgery 08/27/2018, 7:52 AM Pager: [phone]

## 2018-08-27 NOTE — Transfer of Care (Signed)
Immediate Anesthesia Transfer of Care Note  Patient: Timothy Hoover  Procedure(s) Performed: APPENDECTOMY LAPAROSCOPIC (N/A Abdomen)  Patient Location: PACU  Anesthesia Type:General  Level of Consciousness: awake, alert  and oriented  Airway & Oxygen Therapy: Patient Spontanous Breathing and Patient connected to face mask oxygen  Post-op Assessment: Report given to RN and Post -op Vital signs reviewed and stable  Post vital signs: Reviewed and stable  Last Vitals:  Vitals Value Taken Time  BP    Temp    Pulse 68 08/27/2018 10:41 AM  Resp 16 08/27/2018 10:41 AM  SpO2 100 % 08/27/2018 10:41 AM  Vitals shown include unvalidated device data.  Last Pain:  Vitals:   08/27/18 0655  TempSrc:   PainSc: 2          Complications: No apparent anesthesia complications

## 2018-08-27 NOTE — Progress Notes (Signed)
Gurjot Brisco( [place]) [phone]

## 2018-08-27 NOTE — Op Note (Signed)
APPENDECTOMY LAPAROSCOPIC  Procedure Note  Timothy Hoover 08/27/2018   Pre-op Diagnosis: acute appendicitis     Post-op Diagnosis: same  Procedure(s): APPENDECTOMY LAPAROSCOPIC  Surgeon(s): Coralie Keens, MD  Anesthesia: General  Staff:  Circulator: Antonieta Pert, RN; Anderson Malta, RN Scrub Person: Joellen Jersey, CST; Marcelina Morel M  Estimated Blood Loss: Minimal               Specimens: sent to path  Findings: The patient was found to have acute the tip of the appendix was retrocecal.  There is moderate inflammation earlier in the retroperitoneum and some vigorous exudate but no perforation.  Procedure: Patient was brought to the operating identified as correct patient.  He was on the operating table general anesthesia.  His abdomen was then prepped and draped in the usual sterile fashion.  Made a small incision of the umbilicus with a scalpel.  Take this down to the fascia which was then opened with a small fascial defect at the umbilicus.  A hemostat into the abdominal cavity under direct vision.  A 0 Vicryl pursestring suture was placed around the fascial opening.  The soft multiplicity opening insufflation of the abdomen was obtained. Patient's right upper quadrant and another in the left lower quadrant under direct vision.  I was then able to identify the cecum terminal ileum appendix.  The base of the appendix came out anteriorly and ileum.  The appendix and back into the retroperitoneum behind the cecum.  Transecting the base of the appendix with the laparoscopic GIA stapler.  I then took down the mesoappendix working toward the retroperitoneum with the harmonic scalpel.  The distal aspect of the appendix was quite inflamed and fixated to the side of the retroperitoneum.  Appendix, the appendix and pulled.  I was able to remove arrested appendix.  The appendix was then placed in Endosac with the distal tip as well and an incision at the umbilicus.  I then  thoroughly irrigated the abdomen with saline.  Hemostasis appeared achieved.  We then deflated the abdomen.  All ports removed under direct vision and the abdomen deflated further.  0 Vicryl was all incisions were anesthetized Marcaine and closed with 4-0 Monocryl subcuticular sutures.  Dermabond was then applied.  Patient tolerated procedure well.  All counts were correct at the end of procedure.  The patient was then extubated in the operating and then taken in a stable condition to the recovery room.          Coralie Keens   Date: 08/27/2018  Time: 10:35 AM

## 2018-08-28 ENCOUNTER — Encounter (HOSPITAL_COMMUNITY): Payer: Self-pay | Admitting: Surgery

## 2018-08-28 LAB — HIV ANTIBODY (ROUTINE TESTING W REFLEX): HIV Screen 4th Generation wRfx: NONREACTIVE

## 2018-09-09 ENCOUNTER — Telehealth: Payer: Self-pay | Admitting: Oncology

## 2018-09-09 ENCOUNTER — Telehealth: Payer: Self-pay

## 2018-09-09 NOTE — Telephone Encounter (Signed)
Spoke with wife, Arif Amendola. She relayed that she has spoken with Dr. Benay Spice and would like to schedule her husband's apt with Dr. Benay Spice the end of June. She reports that he "has had enough doctors right now." She has my contact information for questions or concerns.  Appointment scheduled for 6/29 @ 2 PM. Will call to advise of apt d/t.

## 2018-09-09 NOTE — Telephone Encounter (Signed)
A new patient appt has been scheduled for the pt to see Dr. Benay Spice on 6/29 at [place], GI navigator will notify the pt.

## 2018-09-18 ENCOUNTER — Other Ambulatory Visit: Payer: Self-pay | Admitting: Gastroenterology

## 2018-09-18 DIAGNOSIS — D136 Benign neoplasm of pancreas: Secondary | ICD-10-CM

## 2018-10-06 MED FILL — SILDENAFIL CITRATE 20 MG TA: 20 | 25 days supply | Qty: 50 | Fill #3

## 2018-10-19 ENCOUNTER — Inpatient Hospital Stay: Payer: 59

## 2018-10-19 ENCOUNTER — Other Ambulatory Visit: Payer: Self-pay

## 2018-10-19 ENCOUNTER — Inpatient Hospital Stay: Payer: 59 | Attending: Oncology | Admitting: Oncology

## 2018-10-19 VITALS — BP 166/91 | HR 45 | Temp 99.3°F | Resp 18 | Ht 72.0 in | Wt 186.7 lb

## 2018-10-19 DIAGNOSIS — R001 Bradycardia, unspecified: Secondary | ICD-10-CM

## 2018-10-19 DIAGNOSIS — C7A020 Malignant carcinoid tumor of the appendix: Secondary | ICD-10-CM | POA: Diagnosis not present

## 2018-10-19 DIAGNOSIS — K869 Disease of pancreas, unspecified: Secondary | ICD-10-CM | POA: Diagnosis not present

## 2018-10-19 NOTE — Progress Notes (Addendum)
[place] New Patient Consult   Requesting MD: Timothy Hoover 63 y.o.  Dec 04, 1955    Reason for Consult: Carcinoid of the appendix   HPI: Timothy Hoover reports intermittent right low abdominal pain for several years.  The pain occurred several times per year.  He has undergone multiple imaging studies over the past several years including abdominal MRI scans.  These have not identified a right adrenal adenoma and a cystic tail of the pancreas lesion.  He has been followed by Dr. Lysle Rubens and Dr. Paulita Fujita for these findings.  He presents the emergency room with severe right lower quadrant pain on 08/27/2018.  A CT of the abdomen and pelvis revealed the cystic pancreas tail lesion and right adrenal adenoma.  The distal appendix was hyperenhancing with extensive fat and edema.  No abscess.  No adenopathy.  No ascites or pneumoperitoneum. The appendix tip is indistinct. He was diagnosed with acute appendicitis.  Dr. Ninfa Linden was consulted and he was taken to the operating room for a laparoscopic appendectomy.  The tip of the appendix was retrocecal with moderate inflammation without a perforation.  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  The KI-67 returned at 2%.  The margins were negative for tumor.  No lymphovascular invasion.  No lymph nodes were submitted.  Tumor cells were positive for CD56, chromogranin, synaptophysin, and CDX-2.  He reports the pain resolved after surgery.  No complaint today.  Past Medical History:  Diagnosis Date  . Hypertension     .  Food impaction December 2019   .  Right adrenal adenoma   .  Cystic pancreas tail lesion-2017   .  Prostate nodule   .  Basal cell carcinoma on the nose  Past Surgical History:  Procedure Laterality Date  . COLONOSCOPY WITH PROPOFOL N/A 10/31/2015   Procedure: COLONOSCOPY WITH PROPOFOL;  Surgeon: Garlan Fair, MD;   [facility];  Service: Endoscopy;  Laterality: N/A;  . ESOPHAGOGASTRODUODENOSCOPY (EGD) WITH PROPOFOL N/A 03/29/2018   Procedure: endoscopy;  Surgeon: Laurence Spates, MD;  Location: [facility];  Service: Endoscopy;  Laterality: N/A;  . FOREIGN BODY REMOVAL  03/29/2018   Procedure: FOREIGN BODY REMOVAL;  Surgeon: Laurence Spates, MD;  [facility];  Service: Endoscopy;;  . HERNIA REPAIR    . LAPAROSCOPIC APPENDECTOMY N/A 08/27/2018   Procedure: APPENDECTOMY LAPAROSCOPIC;  Surgeon: Coralie Keens, MD;  Location: [facility];  Service: General;  Laterality: N/A;  . RHINOPLASTY      Medications: Reviewed  Allergies: No Known Allergies  Family history: No family history of cancer  Social History:   He lives with his wife in [place].  He owns a Editor, commissioning.  He does not use cigarettes.  He reports social alcohol use.  No risk factor for HIV or hepatitis.  ROS:   Positives include: Left hip discomfort when he runs on the road  A complete ROS was otherwise negative.  Physical Exam:  Blood pressure (!) 166/91, pulse (!) 45, temperature 99.3 F (37.4 C), temperature source Temporal, resp. rate 18, height 6' (1.829 m), weight 186 lb 11.2 oz (84.7 kg), SpO2 100 %.  HEENT: Neck without mass Lungs: Clear bilaterally Cardiac: Regular rate and rhythm Abdomen: No hepatosplenomegaly, healed surgical incisions, no mass  Vascular: No leg edema Lymph nodes: No cervical, supraclavicular, axillary, or inguinal nodes Skin: Multiple benign-appearing moles over the trunk Musculoskeletal: No spine tenderness   LAB:  CBC  Lab Results  Component Value Date   WBC 11.8 (H) 08/27/2018   HGB 14.3 08/27/2018   HCT 41.3 08/27/2018   MCV 92.0 08/27/2018   PLT 160 08/27/2018   NEUTROABS 8.8 (H) 08/27/2018        CMP  Lab Results  Component Value Date   NA 137 08/27/2018   K 3.8 08/27/2018   CL 107 08/27/2018   CO2 22 08/27/2018   GLUCOSE 130 (H) 08/27/2018    BUN 18 08/27/2018   CREATININE 1.01 08/27/2018   CALCIUM 9.0 08/27/2018   PROT 7.3 08/27/2018   ALBUMIN 4.7 08/27/2018   AST 17 08/27/2018   ALT 19 08/27/2018   ALKPHOS 76 08/27/2018   BILITOT 1.8 (H) 08/27/2018   GFRNONAA >60 08/27/2018   GFRAA >60 08/27/2018     Imaging:  CT abdomen/pelvis 08/27/2018 and MRI abdomen 09/04/2017-images reviewed with Timothy Hoover and his wife   Assessment/Plan:   1. Carcinoid of the appendix  Incidental carcinoid discovered at the time of an appendectomy, tumor confined to the submucosa  CT abdomen/pelvis 08/27/2018-no focal liver abnormality, no adenopathy, acute appendicitis 2. Acute appendicitis 08/27/2018-status post a laparoscopic appendectomy 3. Cystic pancreas tail lesion diagnosed in 2017 4. Right adrenal adenoma 5. History of a prostate nodule followed by Dr. Diona Fanti 6. Hypertension 7. Basal cell carcinoma of the nose 8. Food impaction December 2019   Disposition:   Timothy Hoover was diagnosed with an incidental carcinoid tumor of the appendix when he presented with acute appendicitis.  There is no clinical or CT evidence of metastatic disease.  I will confirm the tumor size and appendix perforation status with the pathologist.  We obtained a baseline chromogranin A level today.  I do not recommend surveillance imaging for the carcinoid tumor.  He has a good prognosis for a long-term disease-free survival.  He plans to continue follow-up with Dr. Lysle Rubens and Dr. Paulita Fujita for the cystic pancreas lesion and adrenal adenoma.  The CT from 08/27/2018 may be able to serve as the yearly imaging for the pancreas lesion.  He is not scheduled for a follow-up appointment at the Cancer center.  I am available to see him in the future as needed.  I discussed the case with Dr. Lysle Rubens.  He will continue surveillance imaging for the cystic pancreas lesion.  He reports that he has a chronically low heart rate.  Betsy Coder, MD  10/19/2018, 2:14 PM His  case was reviewed at the GI tumor conference on 10/21/2018.  The incidental carcinoid tumor is less than 1 cm in size.   The pancreas cystic lesion has not increased in size.  The CT from 08/27/1998 20 can serve as the 1 year surveillance imaging. I discussed the case with Dr. Lyndon Code.  He is unable to determine whether the appendix was "perforated "versus disrupted at the time of surgery.  There does not appear to be a "tumor "associated perforation. He will continue follow-up with Dr. Lysle Rubens.

## 2018-10-21 LAB — CHROMOGRANIN A: Chromogranin A (ng/mL): 48.2 ng/mL (ref 0.0–101.8)

## 2018-10-22 ENCOUNTER — Telehealth: Payer: Self-pay | Admitting: *Deleted

## 2018-10-22 NOTE — Telephone Encounter (Signed)
Notified wife of normal chromogranin A test and provided her with reference range. Also informed her that the CT scan he had on 08/27/2018 can serve as his 1 year scan. Next scan will be due same time next year.

## 2018-11-09 MED FILL — RAMIPRIL 5 MG CAPS: 5 | 90 days supply | Qty: 90 | Fill #0

## 2018-11-10 DIAGNOSIS — H524 Presbyopia: Secondary | ICD-10-CM | POA: Diagnosis not present

## 2018-11-10 DIAGNOSIS — H5213 Myopia, bilateral: Secondary | ICD-10-CM | POA: Diagnosis not present

## 2018-11-10 DIAGNOSIS — H52221 Regular astigmatism, right eye: Secondary | ICD-10-CM | POA: Diagnosis not present

## 2018-11-12 MED FILL — valACYclovir HCL 1 GM TABS: 1 | 1 days supply | Qty: 4 | Fill #0

## 2018-11-26 IMAGING — MR MR PROSTATE WO/W CM
56 series · 56 of 56 positions shown · IV contrast (Multihance 18 ml)
Comparison: None.

CLINICAL DATA: Elevated PSA. Negative biopsy 6663. Prostate w/wo
contrast.. 18ml of multihance Labs @ 315 Creatine 1.1 Bun 19 gfr 73
Elevated psa levels, PSA 3.6 on 08/20/17, notes under RADIOLOGY ORDER
09/02/17..pathology report attached..^18mL MULTIHANCE GADOBENATE
DIMEGLUMINE 529 MG/ML IV SOLN

EXAM:
MR PROSTATE WITHOUT AND WITH CONTRAST
TECHNIQUE: Multiplanar multisequence MRI images were obtained of the pelvis
centered about the prostate. Pre and post contrast images were
obtained.
CONTRAST:  18mL MULTIHANCE GADOBENATE DIMEGLUMINE 529 MG/ML IV SOLN

[Series 3: T1 · axial · 8.0mm · 1.06mm/px · 1 of 28 slices shown (1 of 2)]
[im 1/28]
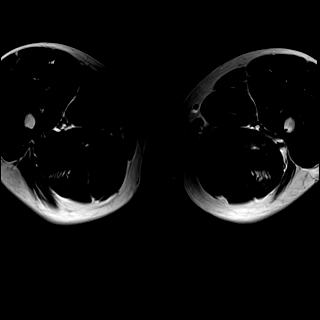

[Series 4: bSSFP fat-sat · axial · 8.0mm · 0.74mm/px · 1 of 28 slices shown]
[im 1/28]
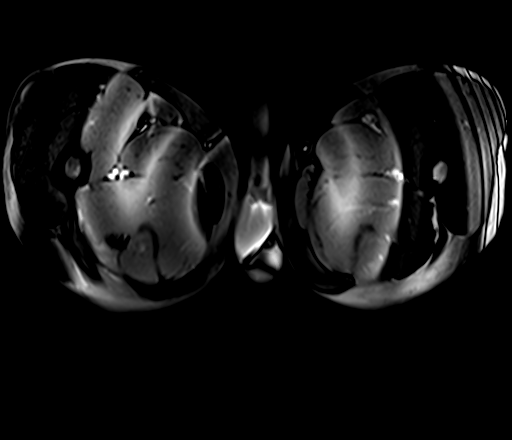

[Series 5: T2 · sagittal · 3.5mm · 0.56mm/px · 1 of 45 slices shown (1 of 4)]
[im 1/45]
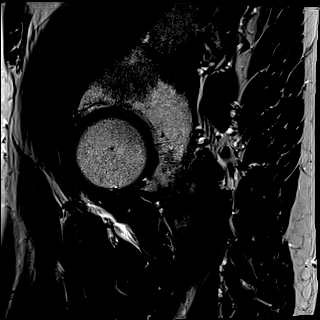

[Series 6: T1 · axial · 3.0mm · 0.35mm/px · 1 of 34 slices shown (2 of 2)]
[im 1/34]
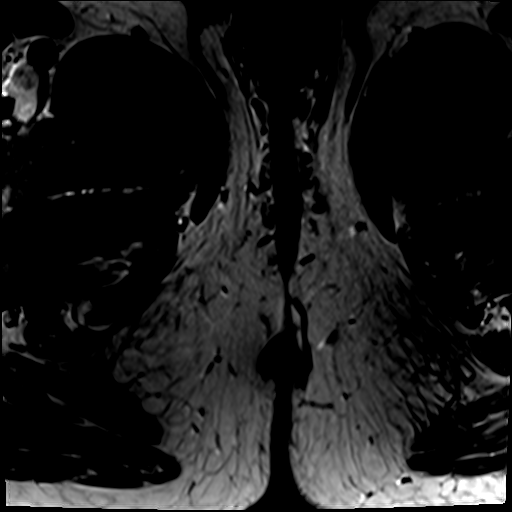

[Series 7: T2 · axial · 3.5mm · 0.56mm/px · 1 of 34 slices shown (2 of 4)]
[im 1/34]
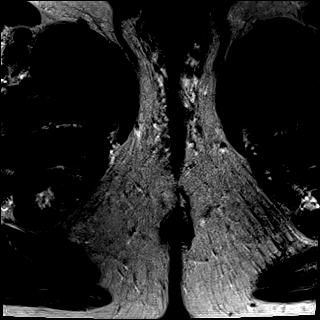

[Series 8: T2 · axial · 1.0mm · 1.04mm/px · 1 of 96 slices shown (3 of 4)]
[im 1/96]
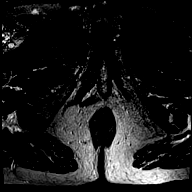

[Series 9: T2 · coronal · 3.5mm · 0.56mm/px · 1 of 44 slices shown (4 of 4)]
[im 1/44]
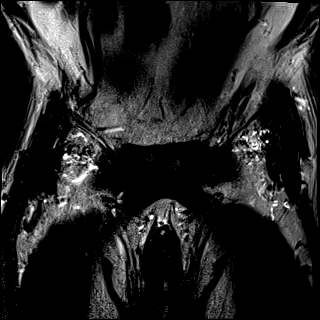

[Series 10: DWI · axial · 3.5mm · 1.56mm/px · 1 of 83 slices shown (1 of 2)]
[im 1/83]
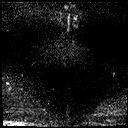

[Series 11: DWI · axial · 3.5mm · 1.56mm/px · 1 of 28 slices shown (2 of 2)]
[im 1/28]
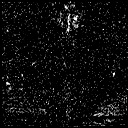

[Series 22: pre t1_twist_tra_dyn_ttc=6.4s · axial · non-contrast · 3.5mm · 0.89mm/px · 1 of 26 slices shown]
[im 1/26]
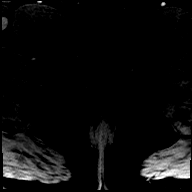

[Series 23: pre t1_twist_tra_dyn_ttc=6.4s_sub · axial · non-contrast · 3.5mm · 0.89mm/px · 1 of 26 slices shown]
[im 1/26]
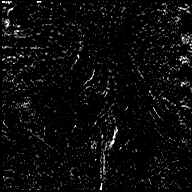

[Series 24: post t1_twist_tra_dyn-copy center · axial · 3.5mm · 0.89mm/px · 1 of 26 slices shown (1 of 23)]
[im 1/26]
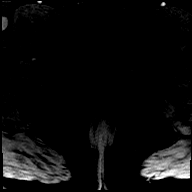

[Series 25: post t1_twist_tra_dyn-copy center · axial · 3.5mm · 0.89mm/px · 1 of 26 slices shown (2 of 23)]
[im 1/26]
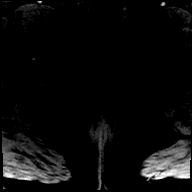

[Series 26: post t1_twist_tra_dyn-copy cent_sub_ttc=(id) · axial · 3.5mm · 0.89mm/px · 1 of 26 slices shown (1 of 22)]
[im 1/26]
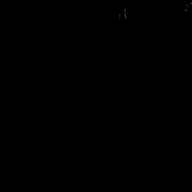

[Series 27: post t1_twist_tra_dyn-copy center · axial · 3.5mm · 0.89mm/px · 1 of 26 slices shown (3 of 23)]
[im 1/26]
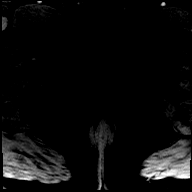

[Series 28: post t1_twist_tra_dyn-copy cent_sub_ttc=(id) · axial · 3.5mm · 0.89mm/px · 1 of 26 slices shown (2 of 22)]
[im 1/26]
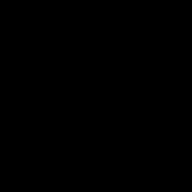

[Series 29: post t1_twist_tra_dyn-copy center · axial · 3.5mm · 0.89mm/px · 1 of 26 slices shown (4 of 23)]
[im 1/26]
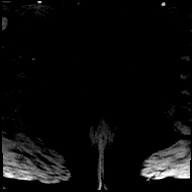

[Series 30: post t1_twist_tra_dyn-copy cent_sub_ttc=(id) · axial · 3.5mm · 0.89mm/px · 1 of 26 slices shown (3 of 22)]
[im 1/26]
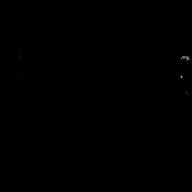

[Series 31: post t1_twist_tra_dyn-copy center · axial · 3.5mm · 0.89mm/px · 1 of 26 slices shown (5 of 23)]
[im 1/26]
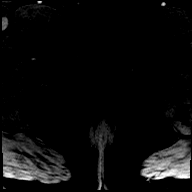

[Series 32: post t1_twist_tra_dyn-copy cent_sub_ttc=(id) · axial · 3.5mm · 0.89mm/px · 1 of 26 slices shown (4 of 22)]
[im 1/26]
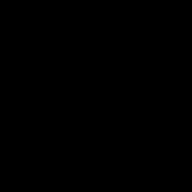

[Series 33: post t1_twist_tra_dyn-copy center · axial · 3.5mm · 0.89mm/px · 1 of 26 slices shown (6 of 23)]
[im 1/26]
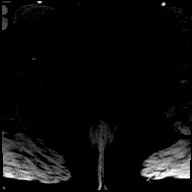

[Series 34: post t1_twist_tra_dyn-copy cent_sub_ttc=(id) · axial · 3.5mm · 0.89mm/px · 1 of 26 slices shown (5 of 22)]
[im 1/26]
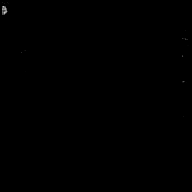

[Series 35: post t1_twist_tra_dyn-copy center · axial · 3.5mm · 0.89mm/px · 1 of 26 slices shown (7 of 23)]
[im 1/26]
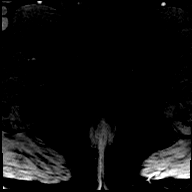

[Series 36: post t1_twist_tra_dyn-copy cent_sub_ttc=(id) · axial · 3.5mm · 0.89mm/px · 1 of 26 slices shown (6 of 22)]
[im 1/26]
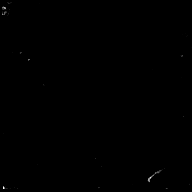

[Series 37: post t1_twist_tra_dyn-copy center · axial · 3.5mm · 0.89mm/px · 1 of 26 slices shown (8 of 23)]
[im 1/26]
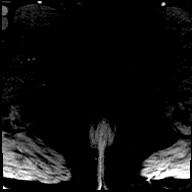

[Series 38: post t1_twist_tra_dyn-copy cent_sub_ttc=(id) · axial · 3.5mm · 0.89mm/px · 1 of 26 slices shown (7 of 22)]
[im 1/26]
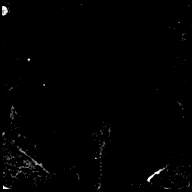

[Series 39: post t1_twist_tra_dyn-copy center · axial · 3.5mm · 0.89mm/px · 1 of 26 slices shown (9 of 23)]
[im 1/26]
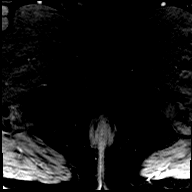

[Series 40: post t1_twist_tra_dyn-copy cent_sub_ttc=(id) · axial · 3.5mm · 0.89mm/px · 1 of 26 slices shown (8 of 22)]
[im 1/26]
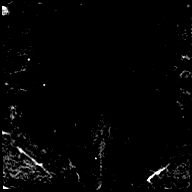

[Series 41: post t1_twist_tra_dyn-copy center · axial · 3.5mm · 0.89mm/px · 1 of 26 slices shown (10 of 23)]
[im 1/26]
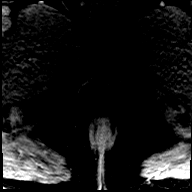

[Series 42: post t1_twist_tra_dyn-copy cent_sub_ttc=(id) · axial · 3.5mm · 0.89mm/px · 1 of 26 slices shown (9 of 22)]
[im 1/26]
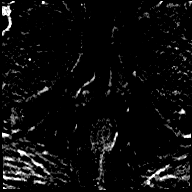

[Series 43: post t1_twist_tra_dyn-copy center · axial · 3.5mm · 0.89mm/px · 1 of 26 slices shown (11 of 23)]
[im 1/26]
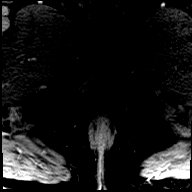

[Series 44: post t1_twist_tra_dyn-copy cent_sub_ttc=(id) · axial · 3.5mm · 0.89mm/px · 1 of 26 slices shown (10 of 22)]
[im 1/26]
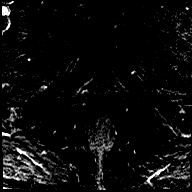

[Series 45: post t1_twist_tra_dyn-copy center · axial · 3.5mm · 0.89mm/px · 1 of 26 slices shown (12 of 23)]
[im 1/26]
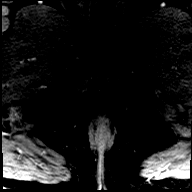

[Series 46: post t1_twist_tra_dyn-copy cent_sub_ttc=(id) · axial · 3.5mm · 0.89mm/px · 1 of 26 slices shown (11 of 22)]
[im 1/26]
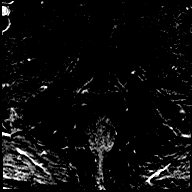

[Series 47: post t1_twist_tra_dyn-copy center · axial · 3.5mm · 0.89mm/px · 1 of 26 slices shown (13 of 23)]
[im 1/26]
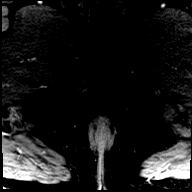

[Series 48: post t1_twist_tra_dyn-copy cent_sub_ttc=(id) · axial · 3.5mm · 0.89mm/px · 1 of 26 slices shown (12 of 22)]
[im 1/26]
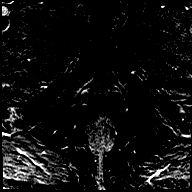

[Series 49: post t1_twist_tra_dyn-copy center · axial · 3.5mm · 0.89mm/px · 1 of 26 slices shown (14 of 23)]
[im 1/26]
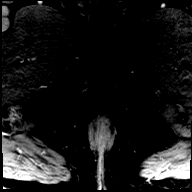

[Series 50: post t1_twist_tra_dyn-copy cent_sub_ttc=(id) · axial · 3.5mm · 0.89mm/px · 1 of 26 slices shown (13 of 22)]
[im 1/26]
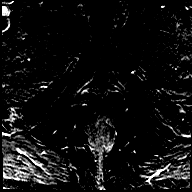

[Series 51: post t1_twist_tra_dyn-copy center · axial · 3.5mm · 0.89mm/px · 1 of 26 slices shown (15 of 23)]
[im 1/26]
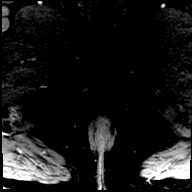

[Series 52: post t1_twist_tra_dyn-copy cent_sub_ttc=(id) · axial · 3.5mm · 0.89mm/px · 1 of 26 slices shown (14 of 22)]
[im 1/26]
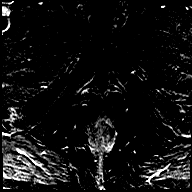

[Series 53: post t1_twist_tra_dyn-copy center · axial · 3.5mm · 0.89mm/px · 1 of 26 slices shown (16 of 23)]
[im 1/26]
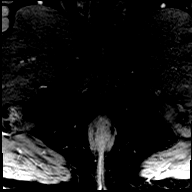

[Series 54: post t1_twist_tra_dyn-copy cent_sub_ttc=(id) · axial · 3.5mm · 0.89mm/px · 1 of 26 slices shown (15 of 22)]
[im 1/26]
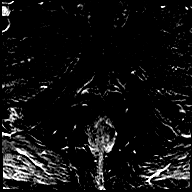

[Series 55: post t1_twist_tra_dyn-copy center · axial · 3.5mm · 0.89mm/px · 1 of 26 slices shown (17 of 23)]
[im 1/26]
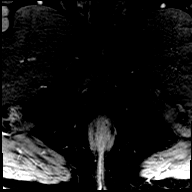

[Series 56: post t1_twist_tra_dyn-copy cent_sub_ttc=(id) · axial · 3.5mm · 0.89mm/px · 1 of 26 slices shown (16 of 22)]
[im 1/26]
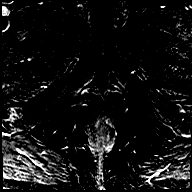

[Series 57: post t1_twist_tra_dyn-copy center · axial · 3.5mm · 0.89mm/px · 1 of 26 slices shown (18 of 23)]
[im 1/26]
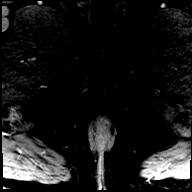

[Series 58: post t1_twist_tra_dyn-copy cent_sub_ttc=(id) · axial · 3.5mm · 0.89mm/px · 1 of 26 slices shown (17 of 22)]
[im 1/26]
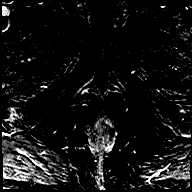

[Series 59: post t1_twist_tra_dyn-copy center · axial · 3.5mm · 0.89mm/px · 1 of 26 slices shown (19 of 23)]
[im 1/26]
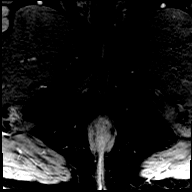

[Series 60: post t1_twist_tra_dyn-copy cent_sub_ttc=(id) · axial · 3.5mm · 0.89mm/px · 1 of 26 slices shown (18 of 22)]
[im 1/26]
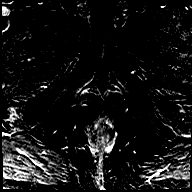

[Series 61: post t1_twist_tra_dyn-copy center · axial · 3.5mm · 0.89mm/px · 1 of 26 slices shown (20 of 23)]
[im 1/26]
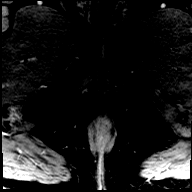

[Series 62: post t1_twist_tra_dyn-copy cent_sub_ttc=(id) · axial · 3.5mm · 0.89mm/px · 1 of 26 slices shown (19 of 22)]
[im 1/26]
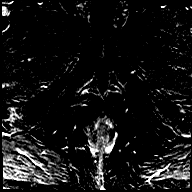

[Series 63: post t1_twist_tra_dyn-copy center · axial · 3.5mm · 0.89mm/px · 1 of 26 slices shown (21 of 23)]
[im 1/26]
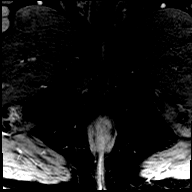

[Series 64: post t1_twist_tra_dyn-copy cent_sub_ttc=(id) · axial · 3.5mm · 0.89mm/px · 1 of 26 slices shown (20 of 22)]
[im 1/26]
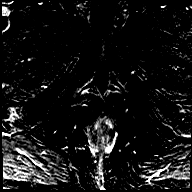

[Series 65: post t1_twist_tra_dyn-copy center · axial · 3.5mm · 0.89mm/px · 1 of 26 slices shown (22 of 23)]
[im 1/26]
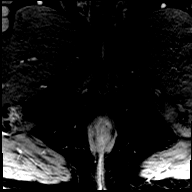

[Series 66: post t1_twist_tra_dyn-copy cent_sub_ttc=(id) · axial · 3.5mm · 0.89mm/px · 1 of 26 slices shown (21 of 22)]
[im 1/26]
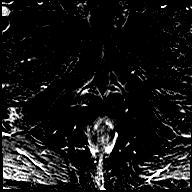

[Series 67: post t1_twist_tra_dyn-copy center · axial · 3.5mm · 0.89mm/px · 1 of 26 slices shown (23 of 23)]
[im 1/26]
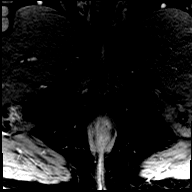

[Series 68: post t1_twist_tra_dyn-copy cent_sub_ttc=(id) · axial · 3.5mm · 0.89mm/px · 1 of 26 slices shown (22 of 22)]
[im 1/26]
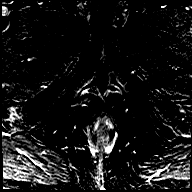

[56 of 56 positions shown; findings below may reference images not displayed]

FINDINGS: Prostate: Within the medial aspect of the LEFT mid gland there is a
14 mm by 10 mm hypointense lesion within the peripheral zone on T2
weighted series 7, image 17. On the coronal imaging, this lesion
appears capsulated (image [DATE]). The lesion does have low signal
intensity on the ADC map consistent with restricted diffusion (image
image [DATE]). No additional foci of abnormal signal intensity within
the peripheral zone on T2 weighted imaging or the diffusion-weighted
imaging.

The transitional zone is nodular with no clear restricted diffusion
or abnormal T2 signal.

Prostatic capsule is intact.  Seminal vesicles normal

Volume: 4.7 x 3.9 x 4.4 cm

Transcapsular spread:  Absent

Seminal vesicle involvement: Absent

Neurovascular bundle involvement: Absent

Pelvic adenopathy: Absent

Bone metastasis: Absent

Other findings: None
IMPRESSION: 1. Rounded nodule within the central LEFT mid gland either
represents high-grade prostate carcinoma or an extruded nodule from
the transitional zone. (PI rads 3).
2. Prostatic capsule intact.
3. Nodular transitional zone.

## 2018-12-02 MED FILL — SILDENAFIL CITRATE 20 MG TA: 20 | 25 days supply | Qty: 50 | Fill #4

## 2018-12-03 DIAGNOSIS — I10 Essential (primary) hypertension: Secondary | ICD-10-CM | POA: Diagnosis not present

## 2018-12-03 DIAGNOSIS — Z0000 Encounter for general adult medical examination without abnormal findings: Secondary | ICD-10-CM | POA: Diagnosis not present

## 2018-12-03 DIAGNOSIS — E782 Mixed hyperlipidemia: Secondary | ICD-10-CM | POA: Diagnosis not present

## 2018-12-03 DIAGNOSIS — Z1389 Encounter for screening for other disorder: Secondary | ICD-10-CM | POA: Diagnosis not present

## 2018-12-03 DIAGNOSIS — N182 Chronic kidney disease, stage 2 (mild): Secondary | ICD-10-CM | POA: Diagnosis not present

## 2018-12-03 DIAGNOSIS — N402 Nodular prostate without lower urinary tract symptoms: Secondary | ICD-10-CM | POA: Diagnosis not present

## 2018-12-03 DIAGNOSIS — R7303 Prediabetes: Secondary | ICD-10-CM | POA: Diagnosis not present

## 2018-12-03 DIAGNOSIS — D3501 Benign neoplasm of right adrenal gland: Secondary | ICD-10-CM | POA: Diagnosis not present

## 2018-12-30 MED FILL — SIMVASTATIN 20 MG TABLET: 20 | 90 days supply | Qty: 90 | Fill #1

## 2019-01-23 DIAGNOSIS — Z23 Encounter for immunization: Secondary | ICD-10-CM | POA: Diagnosis not present

## 2019-02-07 MED FILL — RAMIPRIL 5 MG CAPS: 5 | 90 days supply | Qty: 90 | Fill #1

## 2019-02-08 MED FILL — SILDENAFIL CITRATE 20 MG TA: 20 | 25 days supply | Qty: 50 | Fill #5

## 2019-02-26 DIAGNOSIS — D485 Neoplasm of uncertain behavior of skin: Secondary | ICD-10-CM | POA: Diagnosis not present

## 2019-02-26 DIAGNOSIS — Z85828 Personal history of other malignant neoplasm of skin: Secondary | ICD-10-CM | POA: Diagnosis not present

## 2019-02-26 DIAGNOSIS — C44319 Basal cell carcinoma of skin of other parts of face: Secondary | ICD-10-CM | POA: Diagnosis not present

## 2019-03-16 DIAGNOSIS — I10 Essential (primary) hypertension: Secondary | ICD-10-CM | POA: Diagnosis not present

## 2019-03-16 MED FILL — AMLODIPINE BESYLATE 5 MG TA: 5 | 30 days supply | Qty: 30 | Fill #0

## 2019-03-25 DIAGNOSIS — Z85828 Personal history of other malignant neoplasm of skin: Secondary | ICD-10-CM | POA: Diagnosis not present

## 2019-03-25 DIAGNOSIS — C44319 Basal cell carcinoma of skin of other parts of face: Secondary | ICD-10-CM | POA: Diagnosis not present

## 2019-03-25 MED FILL — MUPIROCIN 2% OINTMENT: 2 | 7 days supply | Qty: 22 | Fill #0

## 2019-04-01 DIAGNOSIS — C44319 Basal cell carcinoma of skin of other parts of face: Secondary | ICD-10-CM | POA: Diagnosis not present

## 2019-04-01 DIAGNOSIS — Z85828 Personal history of other malignant neoplasm of skin: Secondary | ICD-10-CM | POA: Diagnosis not present

## 2019-04-01 DIAGNOSIS — L738 Other specified follicular disorders: Secondary | ICD-10-CM | POA: Diagnosis not present

## 2019-04-09 MED FILL — AMLODIPINE BESYLATE 5 MG TA: 5 | 30 days supply | Qty: 30 | Fill #1

## 2019-04-13 MED FILL — valACYclovir HCL 1 GM TABS: 1 | 1 days supply | Qty: 4 | Fill #1

## 2019-04-14 MED FILL — SIMVASTATIN 20 MG TABLET: 20 | 90 days supply | Qty: 90 | Fill #2

## 2019-04-14 MED FILL — SILDENAFIL CITRATE 20 MG TA: 20 | 25 days supply | Qty: 50 | Fill #0

## 2019-04-14 MED FILL — RAMIPRIL 5 MG CAPS: 5 | 90 days supply | Qty: 90 | Fill #0

## 2019-05-21 MED FILL — AMLODIPINE BESYLATE 5 MG TA: 5 | 30 days supply | Qty: 30 | Fill #2

## 2019-06-07 DIAGNOSIS — N402 Nodular prostate without lower urinary tract symptoms: Secondary | ICD-10-CM | POA: Diagnosis not present

## 2019-06-07 DIAGNOSIS — I10 Essential (primary) hypertension: Secondary | ICD-10-CM | POA: Diagnosis not present

## 2019-06-07 DIAGNOSIS — N182 Chronic kidney disease, stage 2 (mild): Secondary | ICD-10-CM | POA: Diagnosis not present

## 2019-06-07 DIAGNOSIS — E782 Mixed hyperlipidemia: Secondary | ICD-10-CM | POA: Diagnosis not present

## 2019-06-07 DIAGNOSIS — N529 Male erectile dysfunction, unspecified: Secondary | ICD-10-CM | POA: Diagnosis not present

## 2019-06-07 DIAGNOSIS — R7303 Prediabetes: Secondary | ICD-10-CM | POA: Diagnosis not present

## 2019-06-07 MED FILL — SILDENAFIL CITRATE 20 MG TA: 20 | 30 days supply | Qty: 60 | Fill #0

## 2019-06-21 MED FILL — SILDENAFIL CITRATE 20 MG TA: 20 | 30 days supply | Qty: 60 | Fill #0

## 2019-06-21 MED FILL — AMLODIPINE BESYLATE 5 MG TA: 5 | 30 days supply | Qty: 30 | Fill #3

## 2019-07-12 MED FILL — RAMIPRIL 5 MG CAPS: 5 | 90 days supply | Qty: 90 | Fill #1

## 2019-07-27 MED FILL — SIMVASTATIN 20 MG TABLET: 20 | 90 days supply | Qty: 90 | Fill #3

## 2019-07-27 MED FILL — AMLODIPINE BESYLATE 5 MG TA: 5 | 30 days supply | Qty: 30 | Fill #4

## 2019-08-27 MED FILL — AMLODIPINE BESYLATE 5 MG TA: 5 | 90 days supply | Qty: 90 | Fill #5

## 2019-08-27 MED FILL — SILDENAFIL CITRATE 20 MG TA: 20 | 30 days supply | Qty: 60 | Fill #1

## 2019-10-14 MED FILL — RAMIPRIL 5 MG CAPS: 5 | 90 days supply | Qty: 90 | Fill #2

## 2019-11-04 ENCOUNTER — Other Ambulatory Visit (HOSPITAL_COMMUNITY): Payer: Self-pay | Admitting: Internal Medicine

## 2019-11-04 MED FILL — SIMVASTATIN 20 MG TABLET: 20 | 90 days supply | Qty: 90 | Fill #0

## 2019-11-04 MED FILL — SILDENAFIL CITRATE 20 MG TA: 20 | 30 days supply | Qty: 60 | Fill #2

## 2019-11-10 DIAGNOSIS — H524 Presbyopia: Secondary | ICD-10-CM | POA: Diagnosis not present

## 2019-11-10 DIAGNOSIS — H52221 Regular astigmatism, right eye: Secondary | ICD-10-CM | POA: Diagnosis not present

## 2019-11-10 DIAGNOSIS — H5213 Myopia, bilateral: Secondary | ICD-10-CM | POA: Diagnosis not present

## 2019-11-12 MED FILL — SIMVASTATIN 20 MG TABLET: 20 | 90 days supply | Qty: 90 | Fill #0

## 2019-12-06 ENCOUNTER — Other Ambulatory Visit: Payer: Self-pay | Admitting: Internal Medicine

## 2019-12-06 DIAGNOSIS — N182 Chronic kidney disease, stage 2 (mild): Secondary | ICD-10-CM | POA: Diagnosis not present

## 2019-12-06 DIAGNOSIS — K862 Cyst of pancreas: Secondary | ICD-10-CM | POA: Diagnosis not present

## 2019-12-06 DIAGNOSIS — Z0000 Encounter for general adult medical examination without abnormal findings: Secondary | ICD-10-CM | POA: Diagnosis not present

## 2019-12-06 DIAGNOSIS — I10 Essential (primary) hypertension: Secondary | ICD-10-CM | POA: Diagnosis not present

## 2019-12-06 DIAGNOSIS — Z1389 Encounter for screening for other disorder: Secondary | ICD-10-CM | POA: Diagnosis not present

## 2019-12-06 DIAGNOSIS — N529 Male erectile dysfunction, unspecified: Secondary | ICD-10-CM | POA: Diagnosis not present

## 2019-12-06 DIAGNOSIS — R7303 Prediabetes: Secondary | ICD-10-CM | POA: Diagnosis not present

## 2019-12-06 DIAGNOSIS — E782 Mixed hyperlipidemia: Secondary | ICD-10-CM | POA: Diagnosis not present

## 2019-12-06 DIAGNOSIS — Z125 Encounter for screening for malignant neoplasm of prostate: Secondary | ICD-10-CM | POA: Diagnosis not present

## 2019-12-06 DIAGNOSIS — N402 Nodular prostate without lower urinary tract symptoms: Secondary | ICD-10-CM | POA: Diagnosis not present

## 2019-12-20 MED FILL — AMLODIPINE BESYLATE 5 MG TA: 5 | 90 days supply | Qty: 90 | Fill #6

## 2019-12-20 MED FILL — SILDENAFIL CITRATE 20 MG TA: 20 | 30 days supply | Qty: 60 | Fill #3

## 2019-12-30 ENCOUNTER — Ambulatory Visit
Admission: RE | Admit: 2019-12-30 | Discharge: 2019-12-30 | Disposition: A | Discharge status: Home / Self Care | Payer: 59 | Source: Ambulatory Visit | Attending: Internal Medicine | Admitting: Internal Medicine

## 2019-12-30 ENCOUNTER — Other Ambulatory Visit: Payer: Self-pay

## 2019-12-30 DIAGNOSIS — K862 Cyst of pancreas: Secondary | ICD-10-CM | POA: Diagnosis not present

## 2019-12-30 MED ORDER — GADOBENATE DIMEGLUMINE 529 MG/ML IV SOLN
18.0000 mL | Freq: Once | INTRAVENOUS | Status: AC | PRN
  Administered 2019-12-30: 18 mL via INTRAVENOUS

## 2020-01-10 ENCOUNTER — Other Ambulatory Visit (HOSPITAL_COMMUNITY): Payer: Self-pay | Admitting: General Practice

## 2020-01-10 MED FILL — valACYclovir HCL 1 GM TABS: 1 | 1 days supply | Qty: 4 | Fill #0

## 2020-01-18 MED FILL — RAMIPRIL 5 MG CAPS: 5 | 90 days supply | Qty: 90 | Fill #3

## 2020-03-02 MED FILL — SIMVASTATIN 20 MG TABLET: 20 | 90 days supply | Qty: 90 | Fill #1

## 2020-03-02 MED FILL — SILDENAFIL CITRATE 20 MG TA: 20 | 30 days supply | Qty: 60 | Fill #4

## 2020-04-07 ENCOUNTER — Other Ambulatory Visit (HOSPITAL_COMMUNITY): Payer: Self-pay | Admitting: Internal Medicine

## 2020-04-07 MED FILL — SILDENAFIL CITRATE 20 MG TA: 20 | 30 days supply | Qty: 60 | Fill #5

## 2020-04-07 MED FILL — AMLODIPINE BESYLATE 5 MG TA: 5 | 90 days supply | Qty: 90 | Fill #0

## 2020-04-18 MED FILL — valACYclovir HCL 1 GM TABS: 1 | 1 days supply | Qty: 4 | Fill #1

## 2020-04-25 ENCOUNTER — Other Ambulatory Visit (HOSPITAL_COMMUNITY): Payer: Self-pay | Admitting: Internal Medicine

## 2020-04-25 MED FILL — RAMIPRIL 5 MG CAPS: 5 | 90 days supply | Qty: 90 | Fill #0

## 2020-05-03 DIAGNOSIS — M67911 Unspecified disorder of synovium and tendon, right shoulder: Secondary | ICD-10-CM | POA: Diagnosis not present

## 2020-05-04 DIAGNOSIS — M7541 Impingement syndrome of right shoulder: Secondary | ICD-10-CM | POA: Diagnosis not present

## 2020-05-10 DIAGNOSIS — M7541 Impingement syndrome of right shoulder: Secondary | ICD-10-CM | POA: Diagnosis not present

## 2020-05-15 DIAGNOSIS — M7541 Impingement syndrome of right shoulder: Secondary | ICD-10-CM | POA: Diagnosis not present

## 2020-05-17 DIAGNOSIS — M7541 Impingement syndrome of right shoulder: Secondary | ICD-10-CM | POA: Diagnosis not present

## 2020-05-22 DIAGNOSIS — M7541 Impingement syndrome of right shoulder: Secondary | ICD-10-CM | POA: Diagnosis not present

## 2020-05-24 DIAGNOSIS — M7541 Impingement syndrome of right shoulder: Secondary | ICD-10-CM | POA: Diagnosis not present

## 2020-05-29 DIAGNOSIS — M25511 Pain in right shoulder: Secondary | ICD-10-CM | POA: Diagnosis not present

## 2020-05-29 DIAGNOSIS — M7541 Impingement syndrome of right shoulder: Secondary | ICD-10-CM | POA: Diagnosis not present

## 2020-05-31 DIAGNOSIS — M7541 Impingement syndrome of right shoulder: Secondary | ICD-10-CM | POA: Diagnosis not present

## 2020-06-08 DIAGNOSIS — N182 Chronic kidney disease, stage 2 (mild): Secondary | ICD-10-CM | POA: Diagnosis not present

## 2020-06-08 DIAGNOSIS — K863 Pseudocyst of pancreas: Secondary | ICD-10-CM | POA: Diagnosis not present

## 2020-06-08 DIAGNOSIS — E782 Mixed hyperlipidemia: Secondary | ICD-10-CM | POA: Diagnosis not present

## 2020-06-08 DIAGNOSIS — I10 Essential (primary) hypertension: Secondary | ICD-10-CM | POA: Diagnosis not present

## 2020-06-09 DIAGNOSIS — M7541 Impingement syndrome of right shoulder: Secondary | ICD-10-CM | POA: Diagnosis not present

## 2020-06-14 DIAGNOSIS — M7541 Impingement syndrome of right shoulder: Secondary | ICD-10-CM | POA: Diagnosis not present

## 2020-06-21 DIAGNOSIS — L570 Actinic keratosis: Secondary | ICD-10-CM | POA: Diagnosis not present

## 2020-06-21 DIAGNOSIS — D485 Neoplasm of uncertain behavior of skin: Secondary | ICD-10-CM | POA: Diagnosis not present

## 2020-06-21 DIAGNOSIS — Z85828 Personal history of other malignant neoplasm of skin: Secondary | ICD-10-CM | POA: Diagnosis not present

## 2020-06-21 DIAGNOSIS — C44319 Basal cell carcinoma of skin of other parts of face: Secondary | ICD-10-CM | POA: Diagnosis not present

## 2020-06-29 ENCOUNTER — Other Ambulatory Visit (HOSPITAL_COMMUNITY): Payer: Self-pay | Admitting: Internal Medicine

## 2020-06-29 MED FILL — SILDENAFIL CITRATE 20 MG TA: 20 | 30 days supply | Qty: 60 | Fill #0

## 2020-06-29 MED FILL — SIMVASTATIN 20 MG TABLET: 20 | 90 days supply | Qty: 90 | Fill #2

## 2020-07-05 ENCOUNTER — Other Ambulatory Visit (HOSPITAL_COMMUNITY): Payer: Self-pay | Admitting: Dermatology

## 2020-07-05 DIAGNOSIS — Z85828 Personal history of other malignant neoplasm of skin: Secondary | ICD-10-CM | POA: Diagnosis not present

## 2020-07-05 DIAGNOSIS — C44319 Basal cell carcinoma of skin of other parts of face: Secondary | ICD-10-CM | POA: Diagnosis not present

## 2020-08-14 ENCOUNTER — Other Ambulatory Visit (HOSPITAL_COMMUNITY): Payer: Self-pay

## 2020-08-14 MED FILL — Ramipril Cap 5 MG: ORAL | 90 days supply | Qty: 90 | Fill #0 | Status: AC

## 2020-09-15 ENCOUNTER — Other Ambulatory Visit (HOSPITAL_COMMUNITY): Payer: Self-pay

## 2020-09-15 MED FILL — Sildenafil Citrate Tab 20 MG: ORAL | 30 days supply | Qty: 60 | Fill #0 | Status: AC

## 2020-09-22 ENCOUNTER — Other Ambulatory Visit (HOSPITAL_COMMUNITY): Payer: Self-pay

## 2020-09-22 MED ORDER — CARESTART COVID-19 HOME TEST VI KIT
PACK | 0 refills | Status: AC
  Filled 2020-09-22: qty 4, 4d supply, fill #0

## 2020-11-07 ENCOUNTER — Other Ambulatory Visit (HOSPITAL_COMMUNITY): Payer: Self-pay

## 2020-11-07 ENCOUNTER — Other Ambulatory Visit: Payer: Self-pay

## 2020-11-07 MED ORDER — SIMVASTATIN 20 MG PO TABS
ORAL_TABLET | ORAL | 4 refills | Status: DC
  Filled 2020-11-07: qty 90, 90d supply, fill #0
  Filled 2021-03-23: qty 90, 90d supply, fill #1
  Filled 2021-07-18: qty 90, 90d supply, fill #2

## 2020-11-07 MED FILL — Sildenafil Citrate Tab 20 MG: ORAL | 30 days supply | Qty: 60 | Fill #1 | Status: AC

## 2020-11-08 ENCOUNTER — Other Ambulatory Visit (HOSPITAL_COMMUNITY): Payer: Self-pay

## 2020-11-15 ENCOUNTER — Other Ambulatory Visit (HOSPITAL_COMMUNITY): Payer: Self-pay

## 2020-11-23 MED FILL — Ramipril Cap 5 MG: ORAL | 90 days supply | Qty: 90 | Fill #1 | Status: AC

## 2020-11-24 ENCOUNTER — Other Ambulatory Visit (HOSPITAL_COMMUNITY): Payer: Self-pay

## 2020-11-25 ENCOUNTER — Other Ambulatory Visit (HOSPITAL_COMMUNITY): Payer: Self-pay

## 2020-11-27 ENCOUNTER — Other Ambulatory Visit (HOSPITAL_COMMUNITY): Payer: Self-pay

## 2020-11-27 MED FILL — Amlodipine Besylate Tab 5 MG (Base Equivalent): ORAL | 90 days supply | Qty: 90 | Fill #0 | Status: AC

## 2020-12-07 ENCOUNTER — Other Ambulatory Visit (HOSPITAL_COMMUNITY): Payer: Self-pay

## 2020-12-07 DIAGNOSIS — E782 Mixed hyperlipidemia: Secondary | ICD-10-CM | POA: Diagnosis not present

## 2020-12-07 DIAGNOSIS — I10 Essential (primary) hypertension: Secondary | ICD-10-CM | POA: Diagnosis not present

## 2020-12-07 DIAGNOSIS — R7309 Other abnormal glucose: Secondary | ICD-10-CM | POA: Diagnosis not present

## 2020-12-07 DIAGNOSIS — I700 Atherosclerosis of aorta: Secondary | ICD-10-CM | POA: Diagnosis not present

## 2020-12-07 DIAGNOSIS — N402 Nodular prostate without lower urinary tract symptoms: Secondary | ICD-10-CM | POA: Diagnosis not present

## 2020-12-07 DIAGNOSIS — Z125 Encounter for screening for malignant neoplasm of prostate: Secondary | ICD-10-CM | POA: Diagnosis not present

## 2020-12-07 DIAGNOSIS — Z0000 Encounter for general adult medical examination without abnormal findings: Secondary | ICD-10-CM | POA: Diagnosis not present

## 2020-12-07 DIAGNOSIS — Z5181 Encounter for therapeutic drug level monitoring: Secondary | ICD-10-CM | POA: Diagnosis not present

## 2020-12-07 DIAGNOSIS — Z1389 Encounter for screening for other disorder: Secondary | ICD-10-CM | POA: Diagnosis not present

## 2020-12-07 DIAGNOSIS — K863 Pseudocyst of pancreas: Secondary | ICD-10-CM | POA: Diagnosis not present

## 2020-12-07 MED ORDER — SIMVASTATIN 20 MG PO TABS: ORAL_TABLET | ORAL | 4 refills | Status: AC

## 2020-12-07 MED ORDER — AMLODIPINE BESYLATE 5 MG PO TABS
ORAL_TABLET | ORAL | 4 refills | Status: DC
  Filled 2020-12-07 – 2021-03-23 (×2): qty 90, 90d supply, fill #0
  Filled 2021-07-13: qty 90, 90d supply, fill #1
  Filled 2021-11-27: qty 90, 90d supply, fill #2

## 2020-12-07 MED ORDER — RAMIPRIL 5 MG PO CAPS: ORAL_CAPSULE | ORAL | 4 refills | Status: AC

## 2020-12-13 DIAGNOSIS — H2513 Age-related nuclear cataract, bilateral: Secondary | ICD-10-CM | POA: Diagnosis not present

## 2020-12-13 DIAGNOSIS — I10 Essential (primary) hypertension: Secondary | ICD-10-CM | POA: Diagnosis not present

## 2020-12-13 DIAGNOSIS — H52221 Regular astigmatism, right eye: Secondary | ICD-10-CM | POA: Diagnosis not present

## 2020-12-13 DIAGNOSIS — H524 Presbyopia: Secondary | ICD-10-CM | POA: Diagnosis not present

## 2020-12-13 DIAGNOSIS — H5213 Myopia, bilateral: Secondary | ICD-10-CM | POA: Diagnosis not present

## 2020-12-13 DIAGNOSIS — H35033 Hypertensive retinopathy, bilateral: Secondary | ICD-10-CM | POA: Diagnosis not present

## 2021-01-09 MED FILL — Sildenafil Citrate Tab 20 MG: ORAL | 30 days supply | Qty: 60 | Fill #2 | Status: AC

## 2021-01-10 ENCOUNTER — Other Ambulatory Visit (HOSPITAL_COMMUNITY): Payer: Self-pay

## 2021-01-24 ENCOUNTER — Other Ambulatory Visit (HOSPITAL_COMMUNITY): Payer: Self-pay

## 2021-01-24 DIAGNOSIS — K219 Gastro-esophageal reflux disease without esophagitis: Secondary | ICD-10-CM | POA: Diagnosis not present

## 2021-01-24 DIAGNOSIS — R1310 Dysphagia, unspecified: Secondary | ICD-10-CM | POA: Diagnosis not present

## 2021-01-24 DIAGNOSIS — Z8719 Personal history of other diseases of the digestive system: Secondary | ICD-10-CM | POA: Diagnosis not present

## 2021-01-24 MED ORDER — FAMOTIDINE 20 MG PO TABS
20.0000 mg | ORAL_TABLET | Freq: Two times a day (BID) | ORAL | 12 refills | Status: AC
  Filled 2021-01-24: qty 60, 30d supply, fill #0
  Filled 2021-02-22: qty 60, 30d supply, fill #1

## 2021-02-05 DIAGNOSIS — K2289 Other specified disease of esophagus: Secondary | ICD-10-CM | POA: Diagnosis not present

## 2021-02-05 DIAGNOSIS — R1310 Dysphagia, unspecified: Secondary | ICD-10-CM | POA: Diagnosis not present

## 2021-02-05 DIAGNOSIS — K3189 Other diseases of stomach and duodenum: Secondary | ICD-10-CM | POA: Diagnosis not present

## 2021-02-05 DIAGNOSIS — K200 Eosinophilic esophagitis: Secondary | ICD-10-CM | POA: Diagnosis not present

## 2021-02-16 ENCOUNTER — Other Ambulatory Visit (HOSPITAL_COMMUNITY): Payer: Self-pay

## 2021-02-16 MED ORDER — FLOVENT DISKUS 50 MCG/ACT IN AEPB
INHALATION_SPRAY | RESPIRATORY_TRACT | 3 refills | Status: DC
  Filled 2021-02-16: qty 60, 30d supply, fill #0

## 2021-02-19 ENCOUNTER — Other Ambulatory Visit (HOSPITAL_COMMUNITY): Payer: Self-pay

## 2021-02-19 MED ORDER — FLUTICASONE PROPIONATE HFA 220 MCG/ACT IN AERO
INHALATION_SPRAY | RESPIRATORY_TRACT | 0 refills | Status: DC
  Filled 2021-02-19: qty 12, 30d supply, fill #0

## 2021-02-20 ENCOUNTER — Other Ambulatory Visit (HOSPITAL_COMMUNITY): Payer: Self-pay

## 2021-02-22 ENCOUNTER — Other Ambulatory Visit (HOSPITAL_COMMUNITY): Payer: Self-pay

## 2021-03-08 MED FILL — Ramipril Cap 5 MG: ORAL | 90 days supply | Qty: 90 | Fill #2 | Status: AC

## 2021-03-09 ENCOUNTER — Other Ambulatory Visit (HOSPITAL_COMMUNITY): Payer: Self-pay

## 2021-03-13 ENCOUNTER — Other Ambulatory Visit (HOSPITAL_COMMUNITY): Payer: Self-pay

## 2021-03-13 MED FILL — Sildenafil Citrate Tab 20 MG: ORAL | 30 days supply | Qty: 60 | Fill #3 | Status: AC

## 2021-03-23 ENCOUNTER — Other Ambulatory Visit (HOSPITAL_COMMUNITY): Payer: Self-pay

## 2021-03-23 MED ORDER — FLUTICASONE PROPIONATE HFA 220 MCG/ACT IN AERO
INHALATION_SPRAY | RESPIRATORY_TRACT | 1 refills | Status: AC
  Filled 2021-03-23: qty 12, 30d supply, fill #0

## 2021-03-26 ENCOUNTER — Other Ambulatory Visit (HOSPITAL_COMMUNITY): Payer: Self-pay

## 2021-04-11 DIAGNOSIS — K3189 Other diseases of stomach and duodenum: Secondary | ICD-10-CM | POA: Diagnosis not present

## 2021-04-11 DIAGNOSIS — K200 Eosinophilic esophagitis: Secondary | ICD-10-CM | POA: Diagnosis not present

## 2021-05-03 DIAGNOSIS — L821 Other seborrheic keratosis: Secondary | ICD-10-CM | POA: Diagnosis not present

## 2021-05-03 DIAGNOSIS — L570 Actinic keratosis: Secondary | ICD-10-CM | POA: Diagnosis not present

## 2021-05-03 DIAGNOSIS — L820 Inflamed seborrheic keratosis: Secondary | ICD-10-CM | POA: Diagnosis not present

## 2021-05-03 DIAGNOSIS — Z85828 Personal history of other malignant neoplasm of skin: Secondary | ICD-10-CM | POA: Diagnosis not present

## 2021-05-14 ENCOUNTER — Other Ambulatory Visit (HOSPITAL_COMMUNITY): Payer: Self-pay

## 2021-05-14 MED FILL — Sildenafil Citrate Tab 20 MG: ORAL | 30 days supply | Qty: 60 | Fill #4 | Status: AC

## 2021-05-28 DIAGNOSIS — K863 Pseudocyst of pancreas: Secondary | ICD-10-CM | POA: Diagnosis not present

## 2021-05-28 DIAGNOSIS — N182 Chronic kidney disease, stage 2 (mild): Secondary | ICD-10-CM | POA: Diagnosis not present

## 2021-05-28 DIAGNOSIS — E782 Mixed hyperlipidemia: Secondary | ICD-10-CM | POA: Diagnosis not present

## 2021-05-28 DIAGNOSIS — R7303 Prediabetes: Secondary | ICD-10-CM | POA: Diagnosis not present

## 2021-05-28 DIAGNOSIS — I700 Atherosclerosis of aorta: Secondary | ICD-10-CM | POA: Diagnosis not present

## 2021-05-28 DIAGNOSIS — I10 Essential (primary) hypertension: Secondary | ICD-10-CM | POA: Diagnosis not present

## 2021-06-05 ENCOUNTER — Other Ambulatory Visit: Payer: Self-pay

## 2021-06-05 ENCOUNTER — Other Ambulatory Visit (HOSPITAL_COMMUNITY): Payer: Self-pay

## 2021-06-05 MED ORDER — RAMIPRIL 5 MG PO CAPS
ORAL_CAPSULE | ORAL | 4 refills | Status: AC
  Filled 2021-06-05: qty 90, 90d supply, fill #0
  Filled 2021-09-24: qty 90, 90d supply, fill #1

## 2021-07-13 ENCOUNTER — Other Ambulatory Visit (HOSPITAL_COMMUNITY): Payer: Self-pay

## 2021-07-13 MED ORDER — SILDENAFIL CITRATE 20 MG PO TABS
ORAL_TABLET | ORAL | 6 refills | Status: DC
  Filled 2021-07-13: qty 60, 30d supply, fill #0
  Filled 2021-09-11: qty 60, 30d supply, fill #1
  Filled 2021-11-16: qty 60, 30d supply, fill #2
  Filled 2022-01-14: qty 60, 30d supply, fill #3
  Filled 2022-03-10: qty 60, 30d supply, fill #4
  Filled 2022-05-09: qty 60, 30d supply, fill #5
  Filled 2022-06-25: qty 60, 30d supply, fill #6

## 2021-07-18 ENCOUNTER — Other Ambulatory Visit (HOSPITAL_COMMUNITY): Payer: Self-pay

## 2021-07-26 DIAGNOSIS — K200 Eosinophilic esophagitis: Secondary | ICD-10-CM | POA: Diagnosis not present

## 2021-07-26 DIAGNOSIS — K3189 Other diseases of stomach and duodenum: Secondary | ICD-10-CM | POA: Diagnosis not present

## 2021-07-26 DIAGNOSIS — K929 Disease of digestive system, unspecified: Secondary | ICD-10-CM | POA: Diagnosis not present

## 2021-07-26 DIAGNOSIS — K2289 Other specified disease of esophagus: Secondary | ICD-10-CM | POA: Diagnosis not present

## 2021-09-12 ENCOUNTER — Other Ambulatory Visit (HOSPITAL_COMMUNITY): Payer: Self-pay

## 2021-09-24 ENCOUNTER — Other Ambulatory Visit (HOSPITAL_COMMUNITY): Payer: Self-pay

## 2021-11-16 ENCOUNTER — Other Ambulatory Visit (HOSPITAL_COMMUNITY): Payer: Self-pay

## 2021-11-16 MED ORDER — SIMVASTATIN 20 MG PO TABS
20.0000 mg | ORAL_TABLET | Freq: Every evening | ORAL | 4 refills | Status: DC
  Filled 2021-11-16: qty 90, 90d supply, fill #0
  Filled 2022-04-16: qty 90, 90d supply, fill #1
  Filled 2022-09-04: qty 90, 90d supply, fill #2

## 2021-11-27 ENCOUNTER — Other Ambulatory Visit (HOSPITAL_COMMUNITY): Payer: Self-pay

## 2021-12-11 ENCOUNTER — Other Ambulatory Visit (HOSPITAL_COMMUNITY): Payer: Self-pay

## 2021-12-11 DIAGNOSIS — I700 Atherosclerosis of aorta: Secondary | ICD-10-CM | POA: Diagnosis not present

## 2021-12-11 DIAGNOSIS — Z125 Encounter for screening for malignant neoplasm of prostate: Secondary | ICD-10-CM | POA: Diagnosis not present

## 2021-12-11 DIAGNOSIS — I10 Essential (primary) hypertension: Secondary | ICD-10-CM | POA: Diagnosis not present

## 2021-12-11 DIAGNOSIS — Z0000 Encounter for general adult medical examination without abnormal findings: Secondary | ICD-10-CM | POA: Diagnosis not present

## 2021-12-11 DIAGNOSIS — E782 Mixed hyperlipidemia: Secondary | ICD-10-CM | POA: Diagnosis not present

## 2021-12-11 DIAGNOSIS — K863 Pseudocyst of pancreas: Secondary | ICD-10-CM | POA: Diagnosis not present

## 2021-12-11 DIAGNOSIS — Z1389 Encounter for screening for other disorder: Secondary | ICD-10-CM | POA: Diagnosis not present

## 2021-12-11 DIAGNOSIS — N402 Nodular prostate without lower urinary tract symptoms: Secondary | ICD-10-CM | POA: Diagnosis not present

## 2021-12-11 DIAGNOSIS — R7303 Prediabetes: Secondary | ICD-10-CM | POA: Diagnosis not present

## 2021-12-11 MED ORDER — SIMVASTATIN 20 MG PO TABS: ORAL_TABLET | ORAL | 4 refills | Status: AC

## 2021-12-11 MED ORDER — RAMIPRIL 5 MG PO CAPS
ORAL_CAPSULE | ORAL | 4 refills | Status: DC
  Filled 2021-12-11: qty 90, 90d supply, fill #0
  Filled 2022-04-25: qty 90, 90d supply, fill #1
  Filled 2022-08-05: qty 90, 90d supply, fill #2
  Filled 2022-11-07: qty 90, 90d supply, fill #3

## 2021-12-11 MED ORDER — AMLODIPINE BESYLATE 5 MG PO TABS
5.0000 mg | ORAL_TABLET | Freq: Every day | ORAL | 4 refills | Status: DC
  Filled 2021-12-11 – 2022-03-10 (×2): qty 90, 90d supply, fill #0
  Filled 2022-06-25: qty 90, 90d supply, fill #1
  Filled 2022-10-11: qty 90, 90d supply, fill #2

## 2021-12-14 DIAGNOSIS — I10 Essential (primary) hypertension: Secondary | ICD-10-CM | POA: Diagnosis not present

## 2021-12-14 DIAGNOSIS — H5213 Myopia, bilateral: Secondary | ICD-10-CM | POA: Diagnosis not present

## 2021-12-14 DIAGNOSIS — H524 Presbyopia: Secondary | ICD-10-CM | POA: Diagnosis not present

## 2021-12-14 DIAGNOSIS — H52221 Regular astigmatism, right eye: Secondary | ICD-10-CM | POA: Diagnosis not present

## 2021-12-14 DIAGNOSIS — H43393 Other vitreous opacities, bilateral: Secondary | ICD-10-CM | POA: Diagnosis not present

## 2021-12-14 DIAGNOSIS — H35033 Hypertensive retinopathy, bilateral: Secondary | ICD-10-CM | POA: Diagnosis not present

## 2021-12-14 DIAGNOSIS — H2513 Age-related nuclear cataract, bilateral: Secondary | ICD-10-CM | POA: Diagnosis not present

## 2022-01-15 ENCOUNTER — Other Ambulatory Visit (HOSPITAL_COMMUNITY): Payer: Self-pay

## 2022-03-07 ENCOUNTER — Other Ambulatory Visit (HOSPITAL_COMMUNITY): Payer: Self-pay

## 2022-03-10 ENCOUNTER — Other Ambulatory Visit (HOSPITAL_COMMUNITY): Payer: Self-pay

## 2022-03-11 ENCOUNTER — Other Ambulatory Visit (HOSPITAL_COMMUNITY): Payer: Self-pay

## 2022-03-12 ENCOUNTER — Other Ambulatory Visit (HOSPITAL_COMMUNITY): Payer: Self-pay

## 2022-04-06 ENCOUNTER — Other Ambulatory Visit (HOSPITAL_COMMUNITY): Payer: Self-pay

## 2022-04-11 DIAGNOSIS — H9313 Tinnitus, bilateral: Secondary | ICD-10-CM | POA: Diagnosis not present

## 2022-04-11 DIAGNOSIS — Z77122 Contact with and (suspected) exposure to noise: Secondary | ICD-10-CM | POA: Diagnosis not present

## 2022-04-11 DIAGNOSIS — H903 Sensorineural hearing loss, bilateral: Secondary | ICD-10-CM | POA: Diagnosis not present

## 2022-04-16 ENCOUNTER — Other Ambulatory Visit: Payer: Self-pay

## 2022-04-17 ENCOUNTER — Other Ambulatory Visit (HOSPITAL_COMMUNITY): Payer: Self-pay

## 2022-04-26 ENCOUNTER — Other Ambulatory Visit (HOSPITAL_COMMUNITY): Payer: Self-pay

## 2022-05-09 ENCOUNTER — Other Ambulatory Visit: Payer: Self-pay

## 2022-05-09 ENCOUNTER — Other Ambulatory Visit (HOSPITAL_COMMUNITY): Payer: Self-pay

## 2022-05-11 ENCOUNTER — Other Ambulatory Visit (HOSPITAL_COMMUNITY): Payer: Self-pay

## 2022-05-29 DIAGNOSIS — C44319 Basal cell carcinoma of skin of other parts of face: Secondary | ICD-10-CM | POA: Diagnosis not present

## 2022-05-29 DIAGNOSIS — L821 Other seborrheic keratosis: Secondary | ICD-10-CM | POA: Diagnosis not present

## 2022-05-29 DIAGNOSIS — C44519 Basal cell carcinoma of skin of other part of trunk: Secondary | ICD-10-CM | POA: Diagnosis not present

## 2022-05-29 DIAGNOSIS — L814 Other melanin hyperpigmentation: Secondary | ICD-10-CM | POA: Diagnosis not present

## 2022-05-29 DIAGNOSIS — D485 Neoplasm of uncertain behavior of skin: Secondary | ICD-10-CM | POA: Diagnosis not present

## 2022-05-29 DIAGNOSIS — Z85828 Personal history of other malignant neoplasm of skin: Secondary | ICD-10-CM | POA: Diagnosis not present

## 2022-05-29 DIAGNOSIS — I788 Other diseases of capillaries: Secondary | ICD-10-CM | POA: Diagnosis not present

## 2022-05-29 DIAGNOSIS — D1801 Hemangioma of skin and subcutaneous tissue: Secondary | ICD-10-CM | POA: Diagnosis not present

## 2022-05-29 DIAGNOSIS — D225 Melanocytic nevi of trunk: Secondary | ICD-10-CM | POA: Diagnosis not present

## 2022-06-12 DIAGNOSIS — C44319 Basal cell carcinoma of skin of other parts of face: Secondary | ICD-10-CM | POA: Diagnosis not present

## 2022-06-14 DIAGNOSIS — N182 Chronic kidney disease, stage 2 (mild): Secondary | ICD-10-CM | POA: Diagnosis not present

## 2022-06-14 DIAGNOSIS — E782 Mixed hyperlipidemia: Secondary | ICD-10-CM | POA: Diagnosis not present

## 2022-06-14 DIAGNOSIS — R001 Bradycardia, unspecified: Secondary | ICD-10-CM | POA: Diagnosis not present

## 2022-06-14 DIAGNOSIS — I10 Essential (primary) hypertension: Secondary | ICD-10-CM | POA: Diagnosis not present

## 2022-06-14 DIAGNOSIS — R7303 Prediabetes: Secondary | ICD-10-CM | POA: Diagnosis not present

## 2022-06-14 DIAGNOSIS — I700 Atherosclerosis of aorta: Secondary | ICD-10-CM | POA: Diagnosis not present

## 2022-06-25 ENCOUNTER — Other Ambulatory Visit: Payer: Self-pay

## 2022-08-05 ENCOUNTER — Other Ambulatory Visit (HOSPITAL_COMMUNITY): Payer: Self-pay

## 2022-08-10 ENCOUNTER — Other Ambulatory Visit (HOSPITAL_COMMUNITY): Payer: Self-pay

## 2022-09-04 ENCOUNTER — Other Ambulatory Visit (HOSPITAL_COMMUNITY): Payer: Self-pay

## 2022-09-05 ENCOUNTER — Other Ambulatory Visit (HOSPITAL_COMMUNITY): Payer: Self-pay

## 2022-09-05 MED ORDER — SILDENAFIL CITRATE 20 MG PO TABS
40.0000 mg | ORAL_TABLET | Freq: Every day | ORAL | 3 refills | Status: DC | PRN
  Filled 2022-09-05: qty 60, 30d supply, fill #0
  Filled 2022-11-07: qty 60, 30d supply, fill #1
  Filled 2022-12-26: qty 60, 30d supply, fill #2
  Filled 2023-03-01: qty 60, 30d supply, fill #3

## 2022-09-07 ENCOUNTER — Other Ambulatory Visit (HOSPITAL_COMMUNITY): Payer: Self-pay

## 2022-11-07 ENCOUNTER — Other Ambulatory Visit (HOSPITAL_COMMUNITY): Payer: Self-pay

## 2022-11-09 ENCOUNTER — Other Ambulatory Visit (HOSPITAL_COMMUNITY): Payer: Self-pay

## 2022-12-17 DIAGNOSIS — H524 Presbyopia: Secondary | ICD-10-CM | POA: Diagnosis not present

## 2022-12-26 ENCOUNTER — Other Ambulatory Visit (HOSPITAL_COMMUNITY): Payer: Self-pay

## 2022-12-27 ENCOUNTER — Other Ambulatory Visit (HOSPITAL_COMMUNITY): Payer: Self-pay

## 2023-01-05 ENCOUNTER — Other Ambulatory Visit (HOSPITAL_COMMUNITY): Payer: Self-pay

## 2023-01-06 ENCOUNTER — Other Ambulatory Visit (HOSPITAL_COMMUNITY): Payer: Self-pay

## 2023-01-06 MED ORDER — AMLODIPINE BESYLATE 5 MG PO TABS
5.0000 mg | ORAL_TABLET | Freq: Every day | ORAL | 4 refills | Status: DC
  Filled 2023-01-06: qty 90, 90d supply, fill #0
  Filled 2023-04-10: qty 90, 90d supply, fill #1
  Filled 2023-07-29: qty 90, 90d supply, fill #2
  Filled 2023-11-30: qty 90, 90d supply, fill #3

## 2023-01-29 ENCOUNTER — Other Ambulatory Visit (HOSPITAL_COMMUNITY): Payer: Self-pay

## 2023-01-29 MED ORDER — SIMVASTATIN 20 MG PO TABS
20.0000 mg | ORAL_TABLET | Freq: Every day | ORAL | 4 refills | Status: DC
  Filled 2023-01-29: qty 90, 90d supply, fill #0
  Filled 2023-06-14: qty 90, 90d supply, fill #1
  Filled 2023-10-10: qty 90, 90d supply, fill #2
  Filled 2024-01-29: qty 90, 90d supply, fill #3

## 2023-02-05 ENCOUNTER — Other Ambulatory Visit (HOSPITAL_COMMUNITY): Payer: Self-pay

## 2023-02-11 ENCOUNTER — Other Ambulatory Visit (HOSPITAL_COMMUNITY): Payer: Self-pay

## 2023-02-11 MED ORDER — RAMIPRIL 5 MG PO CAPS
5.0000 mg | ORAL_CAPSULE | Freq: Every day | ORAL | 4 refills | Status: DC
  Filled 2023-02-11: qty 90, 90d supply, fill #0
  Filled 2023-05-12: qty 90, 90d supply, fill #1
  Filled 2023-08-13: qty 90, 90d supply, fill #2
  Filled 2023-11-17: qty 90, 90d supply, fill #3

## 2023-02-13 ENCOUNTER — Other Ambulatory Visit (HOSPITAL_COMMUNITY): Payer: Self-pay

## 2023-02-18 DIAGNOSIS — R001 Bradycardia, unspecified: Secondary | ICD-10-CM | POA: Diagnosis not present

## 2023-02-18 DIAGNOSIS — N402 Nodular prostate without lower urinary tract symptoms: Secondary | ICD-10-CM | POA: Diagnosis not present

## 2023-02-18 DIAGNOSIS — Z23 Encounter for immunization: Secondary | ICD-10-CM | POA: Diagnosis not present

## 2023-02-18 DIAGNOSIS — N182 Chronic kidney disease, stage 2 (mild): Secondary | ICD-10-CM | POA: Diagnosis not present

## 2023-02-18 DIAGNOSIS — Z125 Encounter for screening for malignant neoplasm of prostate: Secondary | ICD-10-CM | POA: Diagnosis not present

## 2023-02-18 DIAGNOSIS — I700 Atherosclerosis of aorta: Secondary | ICD-10-CM | POA: Diagnosis not present

## 2023-02-18 DIAGNOSIS — E782 Mixed hyperlipidemia: Secondary | ICD-10-CM | POA: Diagnosis not present

## 2023-02-18 DIAGNOSIS — Z0000 Encounter for general adult medical examination without abnormal findings: Secondary | ICD-10-CM | POA: Diagnosis not present

## 2023-02-18 DIAGNOSIS — K863 Pseudocyst of pancreas: Secondary | ICD-10-CM | POA: Diagnosis not present

## 2023-02-18 DIAGNOSIS — I10 Essential (primary) hypertension: Secondary | ICD-10-CM | POA: Diagnosis not present

## 2023-02-18 DIAGNOSIS — R7303 Prediabetes: Secondary | ICD-10-CM | POA: Diagnosis not present

## 2023-02-20 DIAGNOSIS — R972 Elevated prostate specific antigen [PSA]: Secondary | ICD-10-CM | POA: Diagnosis not present

## 2023-03-01 ENCOUNTER — Other Ambulatory Visit (HOSPITAL_COMMUNITY): Payer: Self-pay

## 2023-03-04 ENCOUNTER — Other Ambulatory Visit (HOSPITAL_COMMUNITY): Payer: Self-pay

## 2023-03-05 ENCOUNTER — Other Ambulatory Visit (HOSPITAL_COMMUNITY): Payer: Self-pay

## 2023-04-11 ENCOUNTER — Other Ambulatory Visit (HOSPITAL_COMMUNITY): Payer: Self-pay

## 2023-04-14 ENCOUNTER — Other Ambulatory Visit (HOSPITAL_COMMUNITY): Payer: Self-pay

## 2023-04-14 ENCOUNTER — Other Ambulatory Visit: Payer: Self-pay

## 2023-04-17 ENCOUNTER — Other Ambulatory Visit (HOSPITAL_COMMUNITY): Payer: Self-pay

## 2023-05-01 ENCOUNTER — Other Ambulatory Visit (HOSPITAL_COMMUNITY): Payer: Self-pay

## 2023-05-01 MED ORDER — SILDENAFIL CITRATE 20 MG PO TABS
40.0000 mg | ORAL_TABLET | Freq: Every day | ORAL | 3 refills | Status: DC
  Filled 2023-05-01: qty 60, 30d supply, fill #0
  Filled 2023-06-23: qty 60, 30d supply, fill #1
  Filled 2023-08-13: qty 60, 30d supply, fill #2
  Filled 2023-09-24: qty 60, 30d supply, fill #3

## 2023-05-02 ENCOUNTER — Other Ambulatory Visit (HOSPITAL_COMMUNITY): Payer: Self-pay

## 2023-06-02 DIAGNOSIS — M67912 Unspecified disorder of synovium and tendon, left shoulder: Secondary | ICD-10-CM | POA: Diagnosis not present

## 2023-06-14 ENCOUNTER — Other Ambulatory Visit (HOSPITAL_COMMUNITY): Payer: Self-pay

## 2023-06-24 ENCOUNTER — Other Ambulatory Visit (HOSPITAL_COMMUNITY): Payer: Self-pay

## 2023-07-03 ENCOUNTER — Other Ambulatory Visit (HOSPITAL_COMMUNITY): Payer: Self-pay

## 2023-07-03 DIAGNOSIS — L820 Inflamed seborrheic keratosis: Secondary | ICD-10-CM | POA: Diagnosis not present

## 2023-07-03 DIAGNOSIS — L2089 Other atopic dermatitis: Secondary | ICD-10-CM | POA: Diagnosis not present

## 2023-07-03 DIAGNOSIS — D225 Melanocytic nevi of trunk: Secondary | ICD-10-CM | POA: Diagnosis not present

## 2023-07-03 DIAGNOSIS — C44319 Basal cell carcinoma of skin of other parts of face: Secondary | ICD-10-CM | POA: Diagnosis not present

## 2023-07-03 DIAGNOSIS — L738 Other specified follicular disorders: Secondary | ICD-10-CM | POA: Diagnosis not present

## 2023-07-03 DIAGNOSIS — L821 Other seborrheic keratosis: Secondary | ICD-10-CM | POA: Diagnosis not present

## 2023-07-03 DIAGNOSIS — L814 Other melanin hyperpigmentation: Secondary | ICD-10-CM | POA: Diagnosis not present

## 2023-07-03 DIAGNOSIS — Z85828 Personal history of other malignant neoplasm of skin: Secondary | ICD-10-CM | POA: Diagnosis not present

## 2023-07-03 DIAGNOSIS — D1801 Hemangioma of skin and subcutaneous tissue: Secondary | ICD-10-CM | POA: Diagnosis not present

## 2023-07-03 DIAGNOSIS — L570 Actinic keratosis: Secondary | ICD-10-CM | POA: Diagnosis not present

## 2023-07-03 DIAGNOSIS — D485 Neoplasm of uncertain behavior of skin: Secondary | ICD-10-CM | POA: Diagnosis not present

## 2023-07-03 MED ORDER — TRIAMCINOLONE ACETONIDE 0.1 % EX CREA
TOPICAL_CREAM | CUTANEOUS | 1 refills | Status: AC
  Filled 2023-07-03: qty 80, 30d supply, fill #0

## 2023-07-16 DIAGNOSIS — M67912 Unspecified disorder of synovium and tendon, left shoulder: Secondary | ICD-10-CM | POA: Diagnosis not present

## 2023-07-29 ENCOUNTER — Other Ambulatory Visit (HOSPITAL_COMMUNITY): Payer: Self-pay

## 2023-07-29 DIAGNOSIS — H18832 Recurrent erosion of cornea, left eye: Secondary | ICD-10-CM | POA: Diagnosis not present

## 2023-07-29 MED ORDER — NEOMYCIN-POLYMYXIN-DEXAMETH 3.5-10000-0.1 OP SUSP
OPHTHALMIC | 0 refills | Status: AC
  Filled 2023-07-29: qty 5, 15d supply, fill #0

## 2023-08-06 ENCOUNTER — Other Ambulatory Visit (HOSPITAL_COMMUNITY): Payer: Self-pay

## 2023-08-06 DIAGNOSIS — C44319 Basal cell carcinoma of skin of other parts of face: Secondary | ICD-10-CM | POA: Diagnosis not present

## 2023-08-06 MED ORDER — DOXYCYCLINE HYCLATE 100 MG PO CAPS
100.0000 mg | ORAL_CAPSULE | Freq: Two times a day (BID) | ORAL | 0 refills | Status: AC
  Filled 2023-08-06: qty 10, 5d supply, fill #0

## 2023-08-14 ENCOUNTER — Other Ambulatory Visit: Payer: Self-pay

## 2023-08-19 ENCOUNTER — Other Ambulatory Visit (HOSPITAL_COMMUNITY): Payer: Self-pay

## 2023-08-19 DIAGNOSIS — H18832 Recurrent erosion of cornea, left eye: Secondary | ICD-10-CM | POA: Diagnosis not present

## 2023-08-19 MED ORDER — PREDNISOLONE ACETATE 1 % OP SUSP
OPHTHALMIC | 0 refills | Status: AC
  Filled 2023-08-19: qty 5, 20d supply, fill #0

## 2023-08-19 MED ORDER — DOXYCYCLINE HYCLATE 50 MG PO CAPS
50.0000 mg | ORAL_CAPSULE | Freq: Two times a day (BID) | ORAL | 0 refills | Status: AC
  Filled 2023-08-19: qty 28, 14d supply, fill #0

## 2023-09-25 ENCOUNTER — Other Ambulatory Visit (HOSPITAL_COMMUNITY): Payer: Self-pay

## 2023-11-17 ENCOUNTER — Other Ambulatory Visit (HOSPITAL_COMMUNITY): Payer: Self-pay

## 2023-11-17 MED ORDER — SILDENAFIL CITRATE 20 MG PO TABS
40.0000 mg | ORAL_TABLET | Freq: Every day | ORAL | 3 refills | Status: AC
  Filled 2023-11-17: qty 60, 30d supply, fill #0
  Filled 2024-01-08: qty 60, 30d supply, fill #1
  Filled 2024-02-16: qty 60, 30d supply, fill #2
  Filled 2024-04-21: qty 60, 30d supply, fill #3

## 2023-12-23 ENCOUNTER — Other Ambulatory Visit (HOSPITAL_COMMUNITY): Payer: Self-pay

## 2023-12-23 DIAGNOSIS — H43393 Other vitreous opacities, bilateral: Secondary | ICD-10-CM | POA: Diagnosis not present

## 2023-12-23 DIAGNOSIS — H35033 Hypertensive retinopathy, bilateral: Secondary | ICD-10-CM | POA: Diagnosis not present

## 2023-12-23 DIAGNOSIS — H31002 Unspecified chorioretinal scars, left eye: Secondary | ICD-10-CM | POA: Diagnosis not present

## 2023-12-23 MED ORDER — LOTEMAX SM 0.38 % OP GEL
1.0000 [drp] | Freq: Four times a day (QID) | OPHTHALMIC | 0 refills | Status: AC
  Filled 2023-12-23: qty 5, 12d supply, fill #0

## 2024-01-08 ENCOUNTER — Other Ambulatory Visit (HOSPITAL_COMMUNITY): Payer: Self-pay

## 2024-01-19 DIAGNOSIS — H04122 Dry eye syndrome of left lacrimal gland: Secondary | ICD-10-CM | POA: Diagnosis not present

## 2024-02-11 ENCOUNTER — Other Ambulatory Visit: Payer: Self-pay

## 2024-02-11 ENCOUNTER — Other Ambulatory Visit (HOSPITAL_COMMUNITY): Payer: Self-pay

## 2024-02-11 MED ORDER — LOTEMAX SM 0.38 % OP GEL
1.0000 [drp] | Freq: Every day | OPHTHALMIC | 0 refills | Status: AC
  Filled 2024-02-11: qty 5, 30d supply, fill #0
  Filled 2024-02-11 (×2): qty 5, 100d supply, fill #0
  Filled 2024-02-11: qty 5, 90d supply, fill #0

## 2024-02-12 ENCOUNTER — Other Ambulatory Visit (HOSPITAL_COMMUNITY): Payer: Self-pay

## 2024-02-16 ENCOUNTER — Other Ambulatory Visit: Payer: Self-pay

## 2024-02-16 ENCOUNTER — Other Ambulatory Visit (HOSPITAL_BASED_OUTPATIENT_CLINIC_OR_DEPARTMENT_OTHER): Payer: Self-pay

## 2024-02-16 ENCOUNTER — Other Ambulatory Visit (HOSPITAL_COMMUNITY): Payer: Self-pay

## 2024-02-16 MED ORDER — RAMIPRIL 5 MG PO CAPS
5.0000 mg | ORAL_CAPSULE | Freq: Every day | ORAL | 4 refills | Status: AC
  Filled 2024-02-16: qty 90, 90d supply, fill #0
  Filled 2024-05-24: qty 90, 90d supply, fill #1

## 2024-02-19 ENCOUNTER — Other Ambulatory Visit (HOSPITAL_COMMUNITY): Payer: Self-pay

## 2024-02-19 DIAGNOSIS — E782 Mixed hyperlipidemia: Secondary | ICD-10-CM | POA: Diagnosis not present

## 2024-02-19 DIAGNOSIS — I10 Essential (primary) hypertension: Secondary | ICD-10-CM | POA: Diagnosis not present

## 2024-02-19 DIAGNOSIS — R7303 Prediabetes: Secondary | ICD-10-CM | POA: Diagnosis not present

## 2024-02-19 MED ORDER — ESCITALOPRAM OXALATE 10 MG PO TABS
10.0000 mg | ORAL_TABLET | Freq: Every day | ORAL | 11 refills | Status: AC
  Filled 2024-02-19: qty 30, 7d supply, fill #0
  Filled 2024-03-23: qty 30, 7d supply, fill #1
  Filled 2024-04-21: qty 30, 7d supply, fill #2
  Filled 2024-05-20: qty 30, 7d supply, fill #3

## 2024-03-09 ENCOUNTER — Other Ambulatory Visit (HOSPITAL_COMMUNITY): Payer: Self-pay

## 2024-03-09 MED ORDER — AMLODIPINE BESYLATE 5 MG PO TABS
5.0000 mg | ORAL_TABLET | Freq: Every day | ORAL | 4 refills | Status: AC
  Filled 2024-03-09: qty 90, 90d supply, fill #0

## 2024-04-21 ENCOUNTER — Other Ambulatory Visit (HOSPITAL_COMMUNITY): Payer: Self-pay

## 2024-05-13 ENCOUNTER — Other Ambulatory Visit (HOSPITAL_COMMUNITY): Payer: Self-pay

## 2024-05-13 MED ORDER — SIMVASTATIN 20 MG PO TABS
20.0000 mg | ORAL_TABLET | Freq: Every day | ORAL | 4 refills | Status: AC
  Filled 2024-05-13: qty 90, 90d supply, fill #0

## 2024-05-20 ENCOUNTER — Other Ambulatory Visit (HOSPITAL_COMMUNITY): Payer: Self-pay
